# Patient Record
Sex: Female | Born: 1945 | Race: White | Hispanic: No | Marital: Married | Smoking: Former smoker
Health system: Southern US, Community
[De-identification: ages and names within clinical notes are randomized; demographics above are authoritative.]

## PROBLEM LIST (undated history)

## (undated) DIAGNOSIS — C801 Malignant (primary) neoplasm, unspecified: Secondary | ICD-10-CM

## (undated) DIAGNOSIS — E785 Hyperlipidemia, unspecified: Secondary | ICD-10-CM

## (undated) DIAGNOSIS — G20 Parkinson's disease: Secondary | ICD-10-CM

## (undated) DIAGNOSIS — K219 Gastro-esophageal reflux disease without esophagitis: Secondary | ICD-10-CM

## (undated) DIAGNOSIS — M818 Other osteoporosis without current pathological fracture: Secondary | ICD-10-CM

## (undated) DIAGNOSIS — J302 Other seasonal allergic rhinitis: Secondary | ICD-10-CM

## (undated) DIAGNOSIS — I219 Acute myocardial infarction, unspecified: Secondary | ICD-10-CM

## (undated) DIAGNOSIS — H269 Unspecified cataract: Secondary | ICD-10-CM

## (undated) DIAGNOSIS — K649 Unspecified hemorrhoids: Secondary | ICD-10-CM

## (undated) DIAGNOSIS — K602 Anal fissure, unspecified: Secondary | ICD-10-CM

## (undated) DIAGNOSIS — G20A1 Parkinson's disease without dyskinesia, without mention of fluctuations: Secondary | ICD-10-CM

## (undated) HISTORY — DX: Hyperlipidemia, unspecified: E78.5

## (undated) HISTORY — PX: TOOTH EXTRACTION: SUR596

## (undated) HISTORY — PX: BACK SURGERY: SHX140

## (undated) HISTORY — PX: COLONOSCOPY: SHX174

## (undated) HISTORY — DX: Malignant (primary) neoplasm, unspecified: C80.1

## (undated) HISTORY — PX: CORONARY ANGIOPLASTY: SHX604

## (undated) HISTORY — DX: Other osteoporosis without current pathological fracture: M81.8

## (undated) HISTORY — PX: COLECTOMY: SHX59

## (undated) HISTORY — PX: BLADDER SURGERY: SHX569

## (undated) HISTORY — PX: EYE SURGERY: SHX253

## (undated) HISTORY — PX: OTHER SURGICAL HISTORY: SHX169

## (undated) HISTORY — DX: Unspecified cataract: H26.9

---

## 1994-11-04 DIAGNOSIS — C801 Malignant (primary) neoplasm, unspecified: Secondary | ICD-10-CM

## 1994-11-04 HISTORY — DX: Malignant (primary) neoplasm, unspecified: C80.1

## 2004-09-04 ENCOUNTER — Ambulatory Visit: Payer: Self-pay | Admitting: General Surgery

## 2006-04-21 ENCOUNTER — Ambulatory Visit: Payer: Self-pay | Admitting: Gynecology

## 2006-04-21 ENCOUNTER — Encounter (INDEPENDENT_AMBULATORY_CARE_PROVIDER_SITE_OTHER): Payer: Self-pay | Admitting: Specialist

## 2006-06-16 ENCOUNTER — Ambulatory Visit: Payer: Self-pay | Admitting: Gynecology

## 2007-12-22 ENCOUNTER — Ambulatory Visit: Payer: Self-pay | Admitting: Family Medicine

## 2008-06-07 ENCOUNTER — Ambulatory Visit: Payer: Self-pay | Admitting: Family Medicine

## 2009-05-30 ENCOUNTER — Ambulatory Visit: Payer: Self-pay | Admitting: Family Medicine

## 2009-07-03 ENCOUNTER — Ambulatory Visit: Payer: Self-pay | Admitting: Gastroenterology

## 2009-09-26 ENCOUNTER — Ambulatory Visit (HOSPITAL_COMMUNITY): Admission: AD | Admit: 2009-09-26 | Discharge: 2009-09-27 | Payer: Self-pay | Admitting: Orthopaedic Surgery

## 2009-11-04 HISTORY — PX: OTHER SURGICAL HISTORY: SHX169

## 2010-06-07 ENCOUNTER — Ambulatory Visit: Payer: Self-pay

## 2011-02-06 LAB — URINALYSIS, ROUTINE W REFLEX MICROSCOPIC
Bilirubin Urine: NEGATIVE
Ketones, ur: 15 mg/dL — AB
Nitrite: NEGATIVE
Specific Gravity, Urine: 1.022 (ref 1.005–1.030)
Urobilinogen, UA: 0.2 mg/dL (ref 0.0–1.0)
pH: 5.5 (ref 5.0–8.0)

## 2011-02-06 LAB — URINE MICROSCOPIC-ADD ON

## 2011-02-06 LAB — BASIC METABOLIC PANEL
BUN: 12 mg/dL (ref 6–23)
Creatinine, Ser: 0.63 mg/dL (ref 0.4–1.2)
GFR calc non Af Amer: >60 mL/min (ref >60)
Glucose, Bld: 94 mg/dL (ref 70–99)

## 2011-02-06 LAB — CBC
HCT: 36.7 % (ref 36.0–46.0)
Platelets: 225 10*3/uL (ref 150–400)
RDW: 12.5 % (ref 11.5–15.5)
WBC: 9.9 10*3/uL (ref 4.0–10.5)

## 2011-07-11 ENCOUNTER — Ambulatory Visit: Payer: Self-pay | Admitting: Family Medicine

## 2012-11-19 ENCOUNTER — Ambulatory Visit: Payer: Self-pay | Admitting: Family Medicine

## 2012-11-26 ENCOUNTER — Ambulatory Visit: Payer: Self-pay | Admitting: Family Medicine

## 2013-05-04 ENCOUNTER — Encounter: Payer: Self-pay | Admitting: Diagnostic Neuroimaging

## 2013-05-04 ENCOUNTER — Ambulatory Visit (INDEPENDENT_AMBULATORY_CARE_PROVIDER_SITE_OTHER): Payer: No Typology Code available for payment source | Admitting: Diagnostic Neuroimaging

## 2013-05-04 VITALS — BP 155/82 | HR 67 | Ht 64.0 in | Wt 151.0 lb

## 2013-05-04 DIAGNOSIS — R292 Abnormal reflex: Secondary | ICD-10-CM | POA: Insufficient documentation

## 2013-05-04 DIAGNOSIS — M79601 Pain in right arm: Secondary | ICD-10-CM

## 2013-05-04 DIAGNOSIS — G20 Parkinson's disease: Secondary | ICD-10-CM | POA: Insufficient documentation

## 2013-05-04 DIAGNOSIS — M79609 Pain in unspecified limb: Secondary | ICD-10-CM

## 2013-05-04 NOTE — Progress Notes (Signed)
[facility]  PATIENT: Felicia Snyder DOB: [birth year]  REFERRING CLINICIAN: Craige Cotta HISTORY FROM: patient  REASON FOR VISIT: new consult   HISTORICAL  CHIEF COMPLAINT:  Chief Complaint  Patient presents with  . Neurologic Problem    NP#7    HISTORY OF PRESENT ILLNESS:   67 year old right-handed female here for valuation of right arm problems.  Since Jan 2014, patient has had problems with her right shoulder, diagnosed with frozen shoulder treated conservatively with steroid injection. She also went to physical therapy. Also around this time she developed aching and soreness in her right arm, elbow, with intermittent tingling sensation. She also noted decreased right arm swing. She's having more trouble with fine finger movements and handwriting as well. Patient's mother had Parkinson's disease and now patient is concerned about the similar diagnosis for herself. Patient denies any significant neck pain or radicular type pain.no problems with her left arm. No problems with her feet or legs. No significant balance and gait difficulty. She has lifelong decreased sensation of smell. No REM behavior sleep disorder symptoms. No significant constipation or anxiety.  REVIEW OF SYSTEMS: Full 14 system review of systems performed and notable only for aching muscles weakness.  ALLERGIES: No Known Allergies  HOME MEDICATIONS: No outpatient prescriptions prior to visit.   No facility-administered medications prior to visit.    PAST MEDICAL HISTORY: Past Medical History  Diagnosis Date  . Premature osteoporosis   . Cancer 1996    colon cancer    PAST SURGICAL HISTORY: Past Surgical History  Procedure Laterality Date  . Ruptured disk  [id number]  . Broken ankle  2011    FAMILY HISTORY: Family History  Problem Relation Age of Onset  . Stroke Mother   . Breast cancer Mother   . Parkinsonism Mother   . Heart attack Father   . Colon cancer Maternal Grandmother      SOCIAL HISTORY:  History   Social History  . Marital Status: Married    Spouse Name: N/A    Number of Children: 2  . Years of Education: masteres   Occupational History  . retired    Social History Main Topics  . Smoking status: Former Games developer  . Smokeless tobacco: Not on file  . Alcohol Use: Yes  . Drug Use: No  . Sexually Active: Not on file   Other Topics Concern  . Not on file   Social History Narrative  . No narrative on file     PHYSICAL EXAM  Filed Vitals:   05/04/13 0953  BP: 155/82  Pulse: 67  Height: 5\' 4"  (1.626 m)  Weight: 151 lb (68.493 kg)    Not recorded    Body mass index is 25.91 kg/(m^2).  GENERAL EXAM: Patient is in no distress  CARDIOVASCULAR: Regular rate and rhythm, no murmurs, no carotid bruits  NEUROLOGIC: MENTAL STATUS: awake, alert, language fluent, comprehension intact, naming intact CRANIAL NERVE: no papilledema on fundoscopic exam, pupils equal and reactive to light, visual fields full to confrontation, extraocular muscles intact, no nystagmus, facial sensation and strength symmetric, uvula midline, shoulder shrug symmetric, tongue midline; SLIGHT MASKED FACIES. NEG MYERSONS. NEG SNOUT MOTOR: INCR TONE IN RUE. MARKED BRADYKINESIA IN RUE. NO TREMOR. Full strength in the BUE, LLE; RLE FOOT DF 4/5.  SENSORY: normal and symmetric to light touch, pinprick, temperature, vibration COORDINATION: finger-nose-finger SLOW IN RUE. REFLEXES: RUE 3, LUE 2, KNEES 2, RIGHT ANKLE 0, LEFT ANKLE 1. GAIT/STATION: NO ARM SWING IN RUE. SLIGHTLY STOOPED  POSTURE. narrow based gait; able to walk on toes, heels and tandem; romberg is negative   DIAGNOSTIC DATA (LABS, IMAGING, TESTING) - I reviewed patient records, labs, notes, testing and imaging myself where available.  Lab Results  Component Value Date   WBC 9.9 09/26/2009   HGB 12.7 09/26/2009   HCT 36.7 09/26/2009   MCV 90.2 09/26/2009   PLT 225 09/26/2009      Component Value  Date/Time   NA 139 09/26/2009 1652   K 3.5 09/26/2009 1652   CL 103 09/26/2009 1652   CO2 25 09/26/2009 1652   GLUCOSE 94 09/26/2009 1652   BUN 12 09/26/2009 1652   CREATININE 0.63 09/26/2009 1652   CALCIUM 9.6 09/26/2009 1652   GFRNONAA >60 09/26/2009 1652   GFRAA  Value: >60        The eGFR has been calculated using the MDRD equation. This calculation has not been validated in all clinical situations. eGFR's persistently <60 mL/min signify possible Chronic Kidney Disease. 09/26/2009 1652   No results found for this basename: CHOL, HDL, LDLCALC, LDLDIRECT, TRIG, CHOLHDL   No results found for this basename: HGBA1C   No results found for this basename: VITAMINB12   No results found for this basename: TSH      ASSESSMENT AND PLAN  67 y.o. year old female  has a past medical history of Premature osteoporosis and Cancer (1996). here with constellation of symptoms affecting her right arm including decreased right arm swing, bradykinesia, rigidity, hyperreflexia.   Differential diagnosis: Parkinson's disease, vascular parkinsonism, stroke, cervical spine pathology  PLAN: 1. We'll check MRI brain and cervical spine 2. If unremarkable, then I will try empiric trial of carbidopa/levodopa   Orders Placed This Encounter  Procedures  . MR Brain Wo Contrast  . MR Cervical Spine Wo Contrast    Suanne Marker, MD 05/04/2013, 11:01 AM Certified in Neurology, Neurophysiology and Neuroimaging  [facility] [address], [address] [place], [place] [address] [phone]

## 2013-05-04 NOTE — Patient Instructions (Signed)
I will check MRI brain and cervical spine. 

## 2013-05-13 ENCOUNTER — Telehealth: Payer: Self-pay | Admitting: Diagnostic Neuroimaging

## 2013-05-13 NOTE — Telephone Encounter (Signed)
Okay to dispense Xanax?  Please advise.  Thank you.

## 2013-05-13 NOTE — Telephone Encounter (Signed)
Dispensed Alprazolam 0.5mg  Tablets #3 Lot [id number] Exp 03/2014.  I called the patient.  She will be in to pick up meds tomorrow.

## 2013-05-13 NOTE — Telephone Encounter (Signed)
Xanax ok. -VRP

## 2013-05-15 DIAGNOSIS — G20 Parkinson's disease: Secondary | ICD-10-CM

## 2013-05-18 ENCOUNTER — Other Ambulatory Visit: Payer: Self-pay | Admitting: Diagnostic Neuroimaging

## 2013-05-18 DIAGNOSIS — M79601 Pain in right arm: Secondary | ICD-10-CM

## 2013-05-18 DIAGNOSIS — R292 Abnormal reflex: Secondary | ICD-10-CM

## 2013-05-18 DIAGNOSIS — G20 Parkinson's disease: Secondary | ICD-10-CM

## 2013-05-20 ENCOUNTER — Telehealth: Payer: Self-pay | Admitting: Diagnostic Neuroimaging

## 2013-05-24 NOTE — Telephone Encounter (Signed)
Called patient. No answer. Left vmail.  

## 2013-05-25 ENCOUNTER — Telehealth: Payer: Self-pay | Admitting: Diagnostic Neuroimaging

## 2013-05-25 NOTE — Telephone Encounter (Signed)
I called pt. No answer. -VRP 

## 2013-05-25 NOTE — Telephone Encounter (Signed)
Patient requesting MRI results

## 2013-05-26 ENCOUNTER — Telehealth: Payer: Self-pay | Admitting: Diagnostic Neuroimaging

## 2013-05-26 DIAGNOSIS — G20 Parkinson's disease: Secondary | ICD-10-CM

## 2013-05-26 MED ORDER — CARBIDOPA-LEVODOPA 25-100 MG PO TABS: 1.0000 | ORAL_TABLET | Freq: Three times a day (TID) | ORAL | Status: DC

## 2013-05-26 NOTE — Telephone Encounter (Signed)
I called patient and reviewed results. MRI brain unremarkable. 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  PLAN: 1. Start carbidopa/levodopa 2. Patient requesting second opinion at [facility] movement disorder clinic. I will set this up.  Suanne Marker, MD 05/26/2013, 4:08 PM Certified in Neurology, Neurophysiology and Neuroimaging  [facility] [address] [place], [place] [address] [phone]

## 2013-06-09 ENCOUNTER — Other Ambulatory Visit: Payer: Self-pay

## 2013-08-16 ENCOUNTER — Ambulatory Visit: Payer: No Typology Code available for payment source | Admitting: Diagnostic Neuroimaging

## 2013-09-09 ENCOUNTER — Other Ambulatory Visit: Payer: Self-pay

## 2013-10-26 ENCOUNTER — Ambulatory Visit: Payer: No Typology Code available for payment source | Admitting: Diagnostic Neuroimaging

## 2014-05-19 LAB — LIPID PANEL
CHOLESTEROL: 186 mg/dL (ref 0–200)
HDL: 73 mg/dL — AB (ref 35–70)
LDL Cholesterol: 103 mg/dL
Triglycerides: 48 mg/dL (ref 40–160)

## 2014-05-19 LAB — BASIC METABOLIC PANEL
BUN: 11 mg/dL (ref 4–21)
Creatinine: 0.7 mg/dL (ref 0.5–1.1)
Glucose: 92 mg/dL
Potassium: 4.3 mmol/L (ref 3.4–5.3)
Sodium: 141 mmol/L (ref 137–147)

## 2014-06-15 ENCOUNTER — Ambulatory Visit: Payer: Self-pay | Admitting: Family Medicine

## 2014-06-17 ENCOUNTER — Ambulatory Visit: Payer: Self-pay | Admitting: Family Medicine

## 2014-10-13 DIAGNOSIS — Z85038 Personal history of other malignant neoplasm of large intestine: Secondary | ICD-10-CM | POA: Insufficient documentation

## 2014-10-14 ENCOUNTER — Ambulatory Visit: Payer: Self-pay | Admitting: Family Medicine

## 2014-10-19 ENCOUNTER — Ambulatory Visit: Payer: Self-pay | Admitting: Podiatry

## 2014-11-04 HISTORY — PX: TOTAL ABDOMINAL HYSTERECTOMY: SHX209

## 2014-11-18 ENCOUNTER — Ambulatory Visit: Payer: Self-pay | Admitting: Gastroenterology

## 2014-12-26 DIAGNOSIS — K5909 Other constipation: Secondary | ICD-10-CM | POA: Insufficient documentation

## 2014-12-27 DIAGNOSIS — N812 Incomplete uterovaginal prolapse: Secondary | ICD-10-CM | POA: Insufficient documentation

## 2015-04-25 ENCOUNTER — Other Ambulatory Visit: Payer: Self-pay | Admitting: Otolaryngology

## 2015-04-25 DIAGNOSIS — R1310 Dysphagia, unspecified: Secondary | ICD-10-CM

## 2015-05-01 ENCOUNTER — Other Ambulatory Visit: Payer: Self-pay

## 2015-05-03 ENCOUNTER — Ambulatory Visit: Payer: Self-pay

## 2015-05-15 ENCOUNTER — Telehealth: Payer: Self-pay | Admitting: Family Medicine

## 2015-05-15 NOTE — Telephone Encounter (Signed)
Have cleared her for surgery: denies any cough, chest pain, palpitations or shortness of breath. Will fax latest labs, CXR, EKG, and colonoscopy to [facility].

## 2015-05-15 NOTE — Telephone Encounter (Signed)
Pt states she is returning a call to [place].  JM#[phone]

## 2015-05-18 ENCOUNTER — Ambulatory Visit: Payer: Self-pay

## 2015-06-29 ENCOUNTER — Ambulatory Visit: Payer: Self-pay

## 2015-07-07 ENCOUNTER — Ambulatory Visit
Admission: RE | Admit: 2015-07-07 | Discharge: 2015-07-07 | Disposition: A | Discharge status: Home / Self Care | Payer: Medicare Other | Source: Ambulatory Visit | Attending: Otolaryngology | Admitting: Otolaryngology

## 2015-07-07 DIAGNOSIS — K219 Gastro-esophageal reflux disease without esophagitis: Secondary | ICD-10-CM | POA: Diagnosis not present

## 2015-07-07 DIAGNOSIS — R1313 Dysphagia, pharyngeal phase: Secondary | ICD-10-CM | POA: Insufficient documentation

## 2015-07-07 DIAGNOSIS — R1310 Dysphagia, unspecified: Secondary | ICD-10-CM

## 2015-07-07 DIAGNOSIS — G20 Parkinson's disease: Secondary | ICD-10-CM | POA: Diagnosis not present

## 2015-07-07 DIAGNOSIS — F458 Other somatoform disorders: Secondary | ICD-10-CM | POA: Diagnosis present

## 2015-07-07 DIAGNOSIS — R05 Cough: Secondary | ICD-10-CM | POA: Diagnosis present

## 2015-07-07 NOTE — Therapy (Signed)
[place], [place], [address] Phone: [phone]   Fax:     Modified Barium Swallow  Patient Details  Name: Felicia Snyder MRN: [id number] Date of Birth: [birth year] Referring Provider:  Carloyn Manner, MD  Encounter Date: 07/07/2015      End of Session - 07/07/15 1322    Visit Number 1   Number of Visits 1   Date for SLP Re-Evaluation 07/07/15   SLP Start Time 4967   SLP Stop Time  1323   SLP Time Calculation (min) 44 min   Activity Tolerance Patient tolerated treatment well      Past Medical History  Diagnosis Date  . Premature osteoporosis   . Cancer 1996    colon cancer    Past Surgical History  Procedure Laterality Date  . Ruptured disk  [id number]  . Broken ankle  2011    There were no vitals filed for this visit.  Visit Diagnosis: Pharyngeal dysphagia  Dysphagia - Plan: DG OP Swallowing Func-Medicare/Speech Path, DG OP Swallowing Func-Medicare/Speech Path     Subjective: Patient behavior:Patient is alert; able to convey her medical history and follow directions.  Chief complaint: globus sensation, cough   Objective:  Radiological Procedure: A videoflouroscopic evaluation of oral-preparatory, reflex initiation, and pharyngeal phases of the swallow was performed; as well as a screening of the upper esophageal phase.  I. POSTURE: Upright in MBS chair  II. VIEW: Lateral  III. COMPENSATORY STRATEGIES: N/A  IV. BOLUSES ADMINISTERED:   Thin Liquid: 2 sips, 3 rapid multiple sips   Nectar-thick Liquid: 1 large    Puree: 2 teaspoons   Mechanical Soft: 1/4 graham cracker in apple sauce  V. RESULTS OF EVALUATION: A. ORAL PREPARATORY PHASE: (The lips, tongue, and velum are observed for strength and coordination) Within normal limits       **Overall Severity Rating: WNL  B. SWALLOW INITIATION/REFLEX: (The reflex is normal if "triggered" by the time the bolus  reached the base of the tongue) Within normal limits  **Overall Severity Rating: WNL  C. PHARYNGEAL PHASE: (Pharyngeal function is normal if the bolus shows rapid, smooth, and continuous transit through the pharynx and there is no pharyngeal residue after the swallow) Minimal decreased tongue base retraction with minimal pharyngeal residue post swallow  **Overall Severity Rating: Minimal  D. LARYNGEAL PENETRATION: (Material entering into the laryngeal inlet/vestibule but not aspirated) ? Flash penetration vs. Coating of pyriform sinus  E. ASPIRATION: None  F. ESOPHAGEAL PHASE: (Screening of the upper esophagus)  N/A  ASSESSMENT:  This 69 year old woman; with globus sensation, laryngopharyngeal reflux, and Parkinson's disease; is presenting with minimal pharyngeal dysphagia.  Oral control of the bolus including oral hold, rotary mastication, and anterior to posterior transfer is within normal limits.  Timing of the pharyngeal swallow is within normal limits.  There is trace pharyngeal residue post swallow secondary minimally decreased tongue base retraction for complete pharyngeal pressure generation.  This finding is consistent with effects of laryngopharyngeal reflux (inflammation, edema, and resultant decreased sensation of the larynx and pharynx).  Other pharyngeal aspects of the swallow including hyolaryngeal excursion, duration/amplitude of UES opening, and laryngeal vestibule closure at the height of the swallow are within normal limits.  There is no observed tracheal aspiration.  There was one episode of potential flash trace laryngeal penetration, but it was difficult to discern from coating of the pyriform sinus vs. actual laryngeal penetration.   The patient is not at significant  risk for prandial aspiration and aspiration does not appear to be the etiology of her symptoms.   PLAN/RECOMMENDATIONS:   A. Diet: Regular   B. Swallowing Precautions: Reflux precautions   C. Recommended  consultation to N/A   D. Therapy recommendations N/A  E. Results and recommendations were discussed with the patient immediately following the study                         and the final report will be routed to the referring MD.        G-Codes - 07/26/15 1324    Functional Assessment Tool Used MBS   Functional Limitations Swallowing   Swallow Current Status (W5809) At least 1 percent but less than 20 percent impaired, limited or restricted   Swallow Goal Status (X8338) At least 1 percent but less than 20 percent impaired, limited or restricted   Swallow Discharge Status [id number]) At least 1 percent but less than 20 percent impaired, limited or restricted          Problem List Patient Active Problem List   Diagnosis Date Noted  . Parkinsonism 05/04/2013  . Right arm pain 05/04/2013  . Hyperreflexia 05/04/2013   Leroy Sea, MS/CCC- SLP  Lou Miner 07/26/2015, Nickie Retort PM  [facility] [place], [place], [address] Phone: [phone]   Fax:

## 2015-10-02 DIAGNOSIS — I252 Old myocardial infarction: Secondary | ICD-10-CM | POA: Insufficient documentation

## 2015-10-02 DIAGNOSIS — Z9889 Other specified postprocedural states: Secondary | ICD-10-CM | POA: Insufficient documentation

## 2015-12-11 DIAGNOSIS — N3941 Urge incontinence: Secondary | ICD-10-CM | POA: Diagnosis not present

## 2015-12-11 DIAGNOSIS — N8111 Cystocele, midline: Secondary | ICD-10-CM | POA: Diagnosis not present

## 2015-12-11 DIAGNOSIS — R3914 Feeling of incomplete bladder emptying: Secondary | ICD-10-CM | POA: Diagnosis not present

## 2015-12-18 DIAGNOSIS — H2513 Age-related nuclear cataract, bilateral: Secondary | ICD-10-CM | POA: Diagnosis not present

## 2015-12-20 DIAGNOSIS — C4361 Malignant melanoma of right upper limb, including shoulder: Secondary | ICD-10-CM | POA: Diagnosis not present

## 2016-01-08 DIAGNOSIS — I214 Non-ST elevation (NSTEMI) myocardial infarction: Secondary | ICD-10-CM | POA: Diagnosis not present

## 2016-01-08 DIAGNOSIS — Z9889 Other specified postprocedural states: Secondary | ICD-10-CM | POA: Diagnosis not present

## 2016-03-12 DIAGNOSIS — G20 Parkinson's disease: Secondary | ICD-10-CM | POA: Diagnosis not present

## 2016-04-15 ENCOUNTER — Ambulatory Visit: Payer: Medicare HMO | Attending: Psychiatry | Admitting: Occupational Therapy

## 2016-04-15 ENCOUNTER — Encounter: Payer: Self-pay | Admitting: Occupational Therapy

## 2016-04-15 DIAGNOSIS — R2681 Unsteadiness on feet: Secondary | ICD-10-CM | POA: Insufficient documentation

## 2016-04-15 DIAGNOSIS — M6281 Muscle weakness (generalized): Secondary | ICD-10-CM

## 2016-04-15 DIAGNOSIS — R262 Difficulty in walking, not elsewhere classified: Secondary | ICD-10-CM | POA: Diagnosis not present

## 2016-04-15 DIAGNOSIS — R278 Other lack of coordination: Secondary | ICD-10-CM | POA: Diagnosis not present

## 2016-04-16 ENCOUNTER — Encounter: Payer: Self-pay | Admitting: Occupational Therapy

## 2016-04-16 NOTE — Therapy (Signed)
[place] MAIN [facility] SERVICES [address] [place], [place], [address] Phone: [phone]   Fax:  [phone]  Occupational Therapy Evaluation  Patient Details  Name: Felicia Snyder MRN: [id number] Date of Birth: [birth year] Referring Provider: Mervyn Skeeters  Encounter Date: 04/15/2016      OT End of Session - 04/16/16 1544    Visit Number 1   Number of Visits 17   Authorization Type Medicare G code 1   OT Start Time 1016   OT Stop Time 1120   OT Time Calculation (min) 64 min   Activity Tolerance Patient tolerated treatment well   Behavior During Therapy [facility] for tasks assessed/performed      Past Medical History  Diagnosis Date  . Premature osteoporosis   . Cancer [facility]) 1996    colon cancer    Past Surgical History  Procedure Laterality Date  . Ruptured disk  [id number]  . Broken ankle  2011    There were no vitals filed for this visit.      Subjective Assessment - 04/15/16 1031    Subjective  Patient reports she has had Parkinson's for about 4 years, she has a history of right arm numbness, right shoulder drop and right foot drags, had disc surgery in the 1980s.  She had a frozen shoulder about 5 years ago and unsure if she also had rotator cuff issues in the past as well.    Limitations see above subjective from the patient   Patient Stated Goals Patient reports she wants to have better movement in the right arm, be independent.    Currently in Pain? No/denies   Multiple Pain Sites No           [facility] OT Assessment - 04/15/16 1032    Assessment   Diagnosis Parkinson's disease   Referring Provider [place]   Onset Date 05/04/12   Prior Therapy no   Balance Screen   Has the patient fallen in the past 6 months No   Has the patient had a decrease in activity level because of a fear of falling?  No   Is the patient reluctant to leave their home because of a fear of falling?  No   Home  Environment   Family/patient  expects to be discharged to: Private residence   Living Arrangements Spouse/significant other   Available Help at Discharge Family   Type of Home Two level   Alternate Level Stairs - Number of Steps 14   Bathroom Shower/Tub Walk-in Media planner   Lives With Spouse   Prior Function   Level of [place] Retired   ADL   Eating/Feeding Independent   Grooming Modified independent   Upper Body Bathing Modified independent   Lower Body Bathing Modified independent   Upper Body Dressing Independent   Lower Body Dressing Independent   Toilet Tranfer Modified independent   [place] independent   Tub/Shower Transfer Modified independent   IADL   Prior Level of [place] care of all shopping needs independently   Prior Level of Function Light Housekeeping independent   [place] alone or with occasional assistance   Prior Level of Function Meal Prep independent   Meal Prep Able to complete simple warm meal prep   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   Medication Management Is responsible for taking medication in correct dosages at correct time   Prior Level of Function Financial Management independent   Financial Management Manages financial matters independently (budgets, writes checks, pays rent, bills goes to bank), collects and keeps track of income   Mobility   Mobility Status Independent   Written Expression   Dominant Hand Right   Vision - History   Baseline Vision Wears glasses all the time   Additional Comments has bifocals and with posture she has some difficulty    Cognition   Overall Cognitive Status Within Functional Limits for tasks assessed   Sensation   Light Touch Appears Intact   Stereognosis Appears Intact   Hot/Cold  Appears Intact   Proprioception Appears Intact   Coordination   Gross Motor Movements are Fluid and Coordinated No   Fine Motor Movements are Fluid and Coordinated No   Finger Nose Finger Test mild impairment noted   9 Hole Peg Test Right;Left   Right 9 Hole Peg Test 26 secs   Left 9 Hole Peg Test 27 secs   ROM / Strength   AROM / PROM / Strength AROM;Strength   AROM   Overall AROM  Deficits   Overall AROM Comments Right shoulder flexion to 125 degrees, left 142 degrees.  ABD of the shoulder Right 121 degrees, left 144 degrees.    Strength   Overall Strength Deficits   Overall Strength Comments 4-/5 overall   Hand Function   Right Hand Grip (lbs) 50   Right Hand Lateral Pinch 15 lbs   Right Hand 3 Point Pinch 14 lbs   Left Hand Grip (lbs) 40   Left Hand Lateral Pinch 13 lbs   Left 3 point pinch 14 lbs      Patient reports transient numbness in the right arm at times, especially when she feels under stress, mostly around forearm muscle, mild tingling at times and has difficulty with feeling weight of objects in right hand, for example the difference between regular utensil and plastic utensil.   6 minute walk test 1225 feet 5 times sit to stand 14 secs BERG balance test 51/56 Freezing of gait questionairre 7                    OT Education - 04/16/16 1543    Education provided Yes   Education Details LSVT BIG program details, role of OT, goals   Person(s) Educated Patient   Methods Explanation   Comprehension Verbalized understanding             OT Long Term Goals - 04/16/16 1556    OT LONG TERM GOAL #1   Title Patient will improve gait speed and endurance and be able to walk 1350 feet in 6 minutes to negotiate around the home and community safely in 4 weeks   Baseline 6 minute walk test at evaluation 1225 feet   Time 4   Period Weeks   Status New   OT LONG TERM GOAL #2   Title Patient will complete HEP for maximal daily exercises with modified  independence in 4 weeks     Baseline no current program   Time 4   Period Weeks   Status New   OT LONG TERM GOAL #3   Title Patient will transfer from sit to stand without the use of arms safely and independently from a variety of chairs/surfaces in 4 weeks.   Baseline difficulty from lower surfaces  Time 4   Period Weeks   Status New   OT LONG TERM GOAL #4   Title Patient will decrease frequency of freezing episodes with score of 6 or less on Freezing of Gait questionnaire.      Baseline score of 7 on eval    Time 4   Period Weeks   Status New   OT LONG TERM GOAL #5   Title Patient will be able to obtain clothing from the dryer with modified independence.     Baseline difficulty at eval    Time 4   Period Weeks   Status New               Plan - 16-May-2016 1550    Clinical Impression Statement Patient is a 70 yo female diagnosed with Parkinson's disease and was referred by her physician for LSVT BIG program. Patient presents with forwards flexed posture, occasional tremors in the right hand, decreased step length with gait patterns, decreased reciprocal arm swing (absent on the right), decreased balance, freezing of gait with initiation of gait as well as with turns, decreased coordination, and muscle strength which affect her ability to perform daily tasks. The patient is judged to be an excellent candidate for the LSVT BIG program. She would benefit from and was referred for the LSVT BIG program which is an intensive program designed specifically for Parkinson's patients with a focus on increasing amplitude and speed of movements, improving self-care and daily tasks and providing patients with daily exercises to improve overall function. It is recommended that the patient receive the LSVT BIG program which is comprised of 16 intensive sessions (4 times per week for 4 weeks, one hour sessions). Prognosis for improvement is good based on her motivation and family support. LSVT BIG  has been documented in the literature as efficacious for individuals with Parkinson's disease.       Rehab Potential Good   OT Frequency 4x / week   OT Duration 4 weeks   OT Treatment/Interventions Self-care/ADL training;Therapeutic exercise;Functional Mobility Training;Patient/family education;Neuromuscular education;Balance training;Therapeutic exercises;DME and/or AE instruction;Therapeutic activities;Gait Training;Stair Training   Plan LSVT BIG protocol for evaluation plus 16 treatment sessions.    Consulted and Agree with Plan of Care Patient      Patient will benefit from skilled therapeutic intervention in order to improve the following deficits and impairments:  Abnormal gait, Improper body mechanics, Decreased knowledge of use of DME, Decreased strength, Impaired flexibility, Decreased balance, Decreased mobility, Difficulty walking, Decreased range of motion, Decreased coordination, Impaired UE functional use  Visit Diagnosis: Difficulty in walking, not elsewhere classified  Unsteadiness on feet  Muscle weakness (generalized)  Other lack of coordination      G-Codes - 2016-05-16 1546    Functional Assessment Tool Used clinical judgment, 6 minute walk test, 5 times sit to stand, BERG balance test, freezing of gait questionairre, 9 hole peg test, strength and ROM testing.   Functional Limitation Mobility: Walking and moving around   Mobility: Walking and Moving Around Current Status [phone]) At least 20 percent but less than 40 percent impaired, limited or restricted   Mobility: Walking and Moving Around Goal Status [phone]) At least 1 percent but less than 20 percent impaired, limited or restricted      Problem List Patient Active Problem List   Diagnosis Date Noted  . Parkinsonism ([place]) 05/04/2013  . Right arm pain 05/04/2013  . Hyperreflexia 05/04/2013   Amy T Lovett, OTR/L, CLT  Lovett,Amy 05/16/2016, 4:04 PM  [place] [facility]  SERVICES [address] [place], [place], [address] Phone: [phone]   Fax:  [phone]  Name: Redena Bugaj MRN: [id number] Date of Birth: 04-13-1946

## 2016-04-22 ENCOUNTER — Ambulatory Visit: Payer: Medicare HMO | Admitting: Occupational Therapy

## 2016-04-22 ENCOUNTER — Encounter: Payer: Self-pay | Admitting: Occupational Therapy

## 2016-04-22 DIAGNOSIS — R262 Difficulty in walking, not elsewhere classified: Secondary | ICD-10-CM | POA: Diagnosis not present

## 2016-04-22 DIAGNOSIS — M6281 Muscle weakness (generalized): Secondary | ICD-10-CM

## 2016-04-22 DIAGNOSIS — R278 Other lack of coordination: Secondary | ICD-10-CM

## 2016-04-22 DIAGNOSIS — R2681 Unsteadiness on feet: Secondary | ICD-10-CM

## 2016-04-23 ENCOUNTER — Ambulatory Visit: Payer: Medicare HMO | Admitting: Occupational Therapy

## 2016-04-23 DIAGNOSIS — M6281 Muscle weakness (generalized): Secondary | ICD-10-CM

## 2016-04-23 DIAGNOSIS — R278 Other lack of coordination: Secondary | ICD-10-CM

## 2016-04-23 DIAGNOSIS — R262 Difficulty in walking, not elsewhere classified: Secondary | ICD-10-CM

## 2016-04-23 DIAGNOSIS — R2681 Unsteadiness on feet: Secondary | ICD-10-CM

## 2016-04-23 NOTE — Therapy (Signed)
[place] MAIN [facility] SERVICES [address] [place], [place], [address] Phone: [phone]   Fax:  [phone]  Occupational Therapy Treatment  Patient Details  Name: Felicia Snyder MRN: [id number] Date of Birth: 10-16-1946 Referring Provider: Mervyn Skeeters  Encounter Date: 04/22/2016      OT End of Session - 04/22/16 2017    Visit Number 2   Number of Visits 17   Authorization Type Medicare G code 2   OT Start Time 1015   OT Stop Time 1114   OT Time Calculation (min) 59 min   Activity Tolerance Patient tolerated treatment well   Behavior During Therapy [facility] for tasks assessed/performed      Past Medical History  Diagnosis Date  . Premature osteoporosis   . Cancer [facility]) 1996    colon cancer    Past Surgical History  Procedure Laterality Date  . Ruptured disk  [id number]  . Broken ankle  2011    There were no vitals filed for this visit.      Subjective Assessment - 04/22/16 2008    Subjective  Patient reports she had a good weekend and is ready to get started with therapy this date. No complaints.    Patient Stated Goals Patient reports she wants to have better movement in the right arm, be independent.    Currently in Pain? No/denies   Multiple Pain Sites No                      OT Treatments/Exercises (OP) - 04/23/16 0001    ADLs   ADL Comments Formulation as follows:  1) sit to stand, 2) reaching to the back of her head to perform hair care, 3) reaching down to shoes for tying, 4) fine motor coordination to thread a needle for sewing, 5) picking up and manipulation of small objects 1/2 inch in size or less.    Neurological Re-education Exercises   Other Exercises 1 Patient seen for initial instruction of LSVT BIG exercises: LSVT Daily Session Maximal Daily Exercises: Sustained movements are designed to rescale the amplitude of movement output for generalization to daily functional activities. Performed as  follows for 1 set of 10 repetitions each: Multi directional sustained movements- 1) Floor to ceiling, 2) Side to side. Multi directional Repetitive movements performed in standing and are designed to provide retraining effort needed for sustained muscle activation in tasks Performed as follows: 3) Step and reach forward, 4) Step and Reach Backwards, 5) Step and reach sideways, 6) Rock and reach forward/backward, 7) Rock and reach sideways. Sit to stand from mat table on lowest setting with cues for weight shift, technique and CGA for 10 reps for 1 set.   Patient performing reciprocal stepping exercise with CGA and cues for 10 reps each foot, stair negotiation 4 steps for 5 reps each, cues for big turns, CGA.    Other Exercises 2 Functional mobility with cues for amplitude of steps and reciprocal arm swing for 800 feet with one rest break required, outdoor surfaces with sloped areas, winding pathways, negotiation around obstacles with SBA and cues.  Focused on reciprocal arm swing with cues for right arm.                  OT Education - 04/23/16 2016    Education provided Yes   Education Details Maximal daily exercises, functional component tasks, reciprocal arm swing   Person(s) Educated Patient   Methods Explanation;Demonstration;Verbal cues  Comprehension Verbal cues required;Returned demonstration;Verbalized understanding             OT Long Term Goals - 04/16/16 1556    OT LONG TERM GOAL #1   Title Patient will improve gait speed and endurance and be able to walk 1350 feet in 6 minutes to negotiate around the home and community safely in 4 weeks   Baseline 6 minute walk test at evaluation 1225 feet   Time 4   Period Weeks   Status New   OT LONG TERM GOAL #2   Title Patient will complete HEP for maximal daily exercises with modified independence in 4 weeks     Baseline no current program   Time 4   Period Weeks   Status New   OT LONG TERM GOAL #3   Title Patient will  transfer from sit to stand without the use of arms safely and independently from a variety of chairs/surfaces in 4 weeks.   Baseline difficulty from lower surfaces   Time 4   Period Weeks   Status New   OT LONG TERM GOAL #4   Title Patient will decrease frequency of freezing episodes with score of 6 or less on Freezing of Gait questionnaire.      Baseline score of 7 on eval    Time 4   Period Weeks   Status New   OT LONG TERM GOAL #5   Title Patient will be able to obtain clothing from the dryer with modified independence.     Baseline difficulty at eval    Time 4   Period Weeks   Status New               Plan - 04/23/16 2018    Clinical Impression Statement Patient engaging in initiation of maximal daily exercises this date, able to complete with CGA and cues.  She was able to identify 5 functional component tasks to implement into therapy sessions to impact daily tasks.  Will need additional instruction on daily exercises and continued assessment of balance during exercises prior to issuing home program.  Decreased arm swing on the right during functional mobliity tasks.    Rehab Potential Good   OT Frequency 4x / week   OT Duration 4 weeks   OT Treatment/Interventions Self-care/ADL training;Therapeutic exercise;Functional Mobility Training;Patient/family education;Neuromuscular education;Balance training;Therapeutic exercises;DME and/or AE instruction;Therapeutic activities;Gait Proofreader and Agree with Plan of Care Patient      Patient will benefit from skilled therapeutic intervention in order to improve the following deficits and impairments:  Abnormal gait, Improper body mechanics, Decreased knowledge of use of DME, Decreased strength, Impaired flexibility, Decreased balance, Decreased mobility, Difficulty walking, Decreased range of motion, Decreased coordination, Impaired UE functional use  Visit Diagnosis: Difficulty in walking, not  elsewhere classified  Unsteadiness on feet  Muscle weakness (generalized)  Other lack of coordination    Problem List Patient Active Problem List   Diagnosis Date Noted  . Parkinsonism ([place]) 05/04/2013  . Right arm pain 05/04/2013  . 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

## 2016-04-24 ENCOUNTER — Encounter: Payer: Self-pay | Admitting: Occupational Therapy

## 2016-04-24 ENCOUNTER — Ambulatory Visit: Payer: Medicare HMO | Admitting: Occupational Therapy

## 2016-04-24 DIAGNOSIS — L820 Inflamed seborrheic keratosis: Secondary | ICD-10-CM | POA: Diagnosis not present

## 2016-04-24 DIAGNOSIS — L570 Actinic keratosis: Secondary | ICD-10-CM | POA: Diagnosis not present

## 2016-04-24 DIAGNOSIS — X32XXXD Exposure to sunlight, subsequent encounter: Secondary | ICD-10-CM | POA: Diagnosis not present

## 2016-04-24 DIAGNOSIS — R262 Difficulty in walking, not elsewhere classified: Secondary | ICD-10-CM | POA: Diagnosis not present

## 2016-04-24 DIAGNOSIS — Z08 Encounter for follow-up examination after completed treatment for malignant neoplasm: Secondary | ICD-10-CM | POA: Diagnosis not present

## 2016-04-24 DIAGNOSIS — R2681 Unsteadiness on feet: Secondary | ICD-10-CM

## 2016-04-24 DIAGNOSIS — R278 Other lack of coordination: Secondary | ICD-10-CM

## 2016-04-24 DIAGNOSIS — Z1283 Encounter for screening for malignant neoplasm of skin: Secondary | ICD-10-CM | POA: Diagnosis not present

## 2016-04-24 DIAGNOSIS — M6281 Muscle weakness (generalized): Secondary | ICD-10-CM

## 2016-04-24 DIAGNOSIS — Z85820 Personal history of malignant melanoma of skin: Secondary | ICD-10-CM | POA: Diagnosis not present

## 2016-04-25 ENCOUNTER — Ambulatory Visit: Payer: Medicare HMO | Admitting: Occupational Therapy

## 2016-04-25 ENCOUNTER — Encounter: Payer: Self-pay | Admitting: Occupational Therapy

## 2016-04-25 DIAGNOSIS — M6281 Muscle weakness (generalized): Secondary | ICD-10-CM

## 2016-04-25 DIAGNOSIS — R262 Difficulty in walking, not elsewhere classified: Secondary | ICD-10-CM | POA: Diagnosis not present

## 2016-04-25 DIAGNOSIS — R2681 Unsteadiness on feet: Secondary | ICD-10-CM

## 2016-04-25 DIAGNOSIS — R278 Other lack of coordination: Secondary | ICD-10-CM

## 2016-04-25 NOTE — Therapy (Signed)
[place] MAIN [facility] SERVICES [address] [place], [place], [address] Phone: [phone]   Fax:  [phone]  Occupational Therapy Treatment  Patient Details  Name: Ashvika Llorente MRN: [id number] Date of Birth: January 26, 1946 Referring Provider: Mervyn Skeeters  Encounter Date: 04/23/2016      OT End of Session - 04/24/16 2120    Visit Number 3   Number of Visits 17   Authorization Type Medicare G code 3   OT Start Time 1030   OT Stop Time 1131   OT Time Calculation (min) 61 min   Activity Tolerance Patient tolerated treatment well   Behavior During Therapy [facility] for tasks assessed/performed      Past Medical History  Diagnosis Date  . Premature osteoporosis   . Cancer [facility]) 1996    colon cancer    Past Surgical History  Procedure Laterality Date  . Ruptured disk  [id number]  . Broken ankle  2011    There were no vitals filed for this visit.      Subjective Assessment - 04/24/16 2115    Subjective  Patient reports she was able to use the chair for adapted exercises last night and felt she did better with exercises.    Patient Stated Goals Patient reports she wants to have better movement in the right arm, be independent.    Currently in Pain? No/denies   Multiple Pain Sites No                      OT Treatments/Exercises (OP) - 04/24/16 2117    ADLs   ADL Comments Participation in functional component tasks as follows:  1) sit to stand, 2) reaching to the back of her head to perform hair care, 3) reaching down to shoes for tying, 4) fine motor coordination to thread a needle for sewing, 5) picking up and manipulation of small objects 1/2 inch in size or less, cues provided for BIG movements and technique.   Neurological Re-education Exercises   Other Exercises 1 Patient seen for LSVT BIG exercises: LSVT Daily Session Maximal Daily Exercises: Sustained movements are designed to rescale the amplitude of movement output  for generalization to daily functional activities. Performed as follows for 1 set of 10 repetitions each: Multi directional sustained movements- 1) Floor to ceiling, 2) Side to side. Multi directional Repetitive movements performed in standing and are designed to provide retraining effort needed for sustained muscle activation in tasks Performed as follows: 3) Step and reach forward, 4) Step and Reach Backwards, 5) Step and reach sideways, 6) Rock and reach forward/backward, 7) Rock and reach sideways. Sit to stand from mat table on lowest setting with cues for weight shift, technique and CGA for 10 reps for 1 set. Patient performing reciprocal stepping exercise with CGA and cues for 10 reps each foot, stair negotiation 4 steps for 5 reps each, cues for big turns, CGA.   Other Exercises 2 Functional mobility with cues for amplitude of steps and reciprocal arm swing for 850 feet with one rest break required, outdoor surfaces with sloped areas, winding pathways, negotiation around obstacles with SBA and cues. Focused on reciprocal arm swing with cues for right arm, moderate cuing provided as well as modeling.                OT Education - 04/24/16 2120    Education provided Yes   Education Details adapted exercises, HEP   Person(s) Educated Patient   Methods  Explanation;Demonstration;Verbal cues   Comprehension Verbal cues required;Returned demonstration;Verbalized understanding             OT Long Term Goals - 04/16/16 1556    OT LONG TERM GOAL #1   Title Patient will improve gait speed and endurance and be able to walk 1350 feet in 6 minutes to negotiate around the home and community safely in 4 weeks   Baseline 6 minute walk test at evaluation 1225 feet   Time 4   Period Weeks   Status New   OT LONG TERM GOAL #2   Title Patient will complete HEP for maximal daily exercises with modified independence in 4 weeks     Baseline no current program   Time 4   Period Weeks   Status  New   OT LONG TERM GOAL #3   Title Patient will transfer from sit to stand without the use of arms safely and independently from a variety of chairs/surfaces in 4 weeks.   Baseline difficulty from lower surfaces   Time 4   Period Weeks   Status New   OT LONG TERM GOAL #4   Title Patient will decrease frequency of freezing episodes with score of 6 or less on Freezing of Gait questionnaire.      Baseline score of 7 on eval    Time 4   Period Weeks   Status New   OT LONG TERM GOAL #5   Title Patient will be able to obtain clothing from the dryer with modified independence.     Baseline difficulty at eval    Time 4   Period Weeks   Status New               Plan - 04/24/16 2121    Clinical Impression Statement Reinstruction on LSVT BIG maximal daily exercises in both standard version with therapist assist and adapted version to be performed at home as a part of home program.  Patient requires adapted version at home due to balance deficits. Patient able to complete with cues and modeling.  Continues to demo decreased right arm swing with functional mobility and needs additional reinforcement.    Rehab Potential Good   OT Frequency 4x / week   OT Duration 4 weeks   OT Treatment/Interventions Self-care/ADL training;Therapeutic exercise;Functional Mobility Training;Patient/family education;Neuromuscular education;Balance training;Therapeutic exercises;DME and/or AE instruction;Therapeutic activities;Gait Proofreader and Agree with Plan of Care Patient      Patient will benefit from skilled therapeutic intervention in order to improve the following deficits and impairments:  Abnormal gait, Improper body mechanics, Decreased knowledge of use of DME, Decreased strength, Impaired flexibility, Decreased balance, Decreased mobility, Difficulty walking, Decreased range of motion, Decreased coordination, Impaired UE functional use  Visit Diagnosis: Difficulty in  walking, not elsewhere classified  Unsteadiness on feet  Muscle weakness (generalized)  Other lack of coordination    Problem List Patient Active Problem List   Diagnosis Date Noted  . Parkinsonism ([place]) 05/04/2013  . Right arm pain 05/04/2013  . 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

## 2016-04-26 ENCOUNTER — Encounter: Payer: Self-pay | Admitting: Occupational Therapy

## 2016-04-26 NOTE — Therapy (Signed)
[place] MAIN [facility] SERVICES [address] [place], [place], [address] Phone: [phone]   Fax:  [phone]  Occupational Therapy Treatment  Patient Details  Name: Felicia Snyder MRN: [id number] Date of Birth: 1946-07-21 Referring Provider: Mervyn Skeeters  Encounter Date: 04/24/2016      OT End of Session - 04/25/16 1413    Visit Number 4   Number of Visits 17   Authorization Type Medicare G code 4   OT Start Time 1017   OT Stop Time 1115   OT Time Calculation (min) 58 min   Activity Tolerance Patient tolerated treatment well   Behavior During Therapy [facility] for tasks assessed/performed      Past Medical History  Diagnosis Date  . Premature osteoporosis   . Cancer [facility]) 1996    colon cancer    Past Surgical History  Procedure Laterality Date  . Ruptured disk  [id number]  . Broken ankle  2011    There were no vitals filed for this visit.      Subjective Assessment - 04/25/16 1410    Subjective  Patient reports she is doing well, has to go with husband to a doctor's appt this week in [place].   Patient Stated Goals Patient reports she wants to have better movement in the right arm, be independent.    Currently in Pain? No/denies   Multiple Pain Sites No                      OT Treatments/Exercises (OP) - 04/25/16 1411    ADLs   ADL Comments Functional component tasks as follows: 1) sit to stand, 2) reaching to the back of her head to perform hair care, 3) reaching down to shoes for tying, 4) fine motor coordination to thread a needle for sewing, 5) picking up and manipulation of small objects 1/2 inch in size or less, cues provided for BIG movements and technique.   Neurological Re-education Exercises   Other Exercises 1 Patient seen for LSVT BIG exercises: LSVT Daily Session Maximal Daily Exercises: Sustained movements are designed to rescale the amplitude of movement output for generalization to daily  functional activities. Performed as follows for 1 set of 10 repetitions each: Multi directional sustained movements- 1) Floor to ceiling, 2) Side to side. Multi directional Repetitive movements performed in standing and are designed to provide retraining effort needed for sustained muscle activation in tasks Performed as follows: 3) Step and reach forward, 4) Step and Reach Backwards, 5) Step and reach sideways, 6) Rock and reach forward/backward, 7) Rock and reach sideways. Sit to stand from mat table on lowest setting with cues for weight shift, technique and CGA for 10 reps for 1 set. Patient performing reciprocal stepping exercise with CGA and cues for 10 reps each foot, stair negotiation 4 steps for 5 reps each, cues for big turns, CGA.   Other Exercises 2 Functional mobility with cues for amplitude of steps and reciprocal arm swing for 1000  feet with one rest break required, outdoor surfaces with sloped areas, winding pathways, negotiation around obstacles with SBA and cues. Focused on reciprocal arm swing with cues for right arm, moderate cuing provided as well as modeling.                OT Education - 04/25/16 1413    Education provided Yes             OT Long Term Goals - 04/16/16 1556  OT LONG TERM GOAL #1   Title Patient will improve gait speed and endurance and be able to walk 1350 feet in 6 minutes to negotiate around the home and community safely in 4 weeks   Baseline 6 minute walk test at evaluation 1225 feet   Time 4   Period Weeks   Status New   OT LONG TERM GOAL #2   Title Patient will complete HEP for maximal daily exercises with modified independence in 4 weeks     Baseline no current program   Time 4   Period Weeks   Status New   OT LONG TERM GOAL #3   Title Patient will transfer from sit to stand without the use of arms safely and independently from a variety of chairs/surfaces in 4 weeks.   Baseline difficulty from lower surfaces   Time 4   Period  Weeks   Status New   OT LONG TERM GOAL #4   Title Patient will decrease frequency of freezing episodes with score of 6 or less on Freezing of Gait questionnaire.      Baseline score of 7 on eval    Time 4   Period Weeks   Status New   OT LONG TERM GOAL #5   Title Patient will be able to obtain clothing from the dryer with modified independence.     Baseline difficulty at eval    Time 4   Period Weeks   Status New               Plan - 04/26/16 1414    Clinical Impression Statement Patient has been able to implement adapted exercises at home and has been working on completing a second set each day.  She continues to require cues for reciprocal arm swing during functional mobility.     Rehab Potential Good   OT Frequency 4x / week   OT Duration 4 weeks   OT Treatment/Interventions Self-care/ADL training;Therapeutic exercise;Functional Mobility Training;Patient/family education;Neuromuscular education;Balance training;Therapeutic exercises;DME and/or AE instruction;Therapeutic activities;Gait Proofreader and Agree with Plan of Care Patient      Patient will benefit from skilled therapeutic intervention in order to improve the following deficits and impairments:  Abnormal gait, Improper body mechanics, Decreased knowledge of use of DME, Decreased strength, Impaired flexibility, Decreased balance, Decreased mobility, Difficulty walking, Decreased range of motion, Decreased coordination, Impaired UE functional use  Visit Diagnosis: Difficulty in walking, not elsewhere classified  Unsteadiness on feet  Muscle weakness (generalized)  Other lack of coordination    Problem List Patient Active Problem List   Diagnosis Date Noted  . Parkinsonism ([place]) 05/04/2013  . Right arm pain 05/04/2013  . 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

## 2016-04-26 NOTE — Therapy (Signed)
[place] MAIN [facility] SERVICES [address] [place], [place], [address] Phone: [phone]   Fax:  [phone]  Occupational Therapy Treatment  Patient Details  Name: Felicia Snyder MRN: [id number] Date of Birth: 17-Jul-1946 Referring Provider: Mervyn Skeeters  Encounter Date: 04/25/2016      OT End of Session - 04/26/16 1523    Visit Number 5   Number of Visits 17   Authorization Type Medicare G code 5   OT Start Time 1015   OT Stop Time 1115   OT Time Calculation (min) 60 min   Activity Tolerance Patient tolerated treatment well   Behavior During Therapy [facility] for tasks assessed/performed      Past Medical History  Diagnosis Date  . Premature osteoporosis   . Cancer [facility]) 1996    colon cancer    Past Surgical History  Procedure Laterality Date  . Ruptured disk  [id number]  . Broken ankle  2011    There were no vitals filed for this visit.      Subjective Assessment - 04/26/16 1519    Subjective  Patient reports she is aware she will need to perform exercises 2 times a day over the weekend.  She plans to continue with adapted version of exercises at home for balance.    Patient Stated Goals Patient reports she wants to have better movement in the right arm, be independent.    Currently in Pain? Yes   Pain Score 3    Pain Location Knee   Pain Orientation Right   Pain Onset Today   Aggravating Factors  "catching" sensation on the right at the knee.   Multiple Pain Sites No                      OT Treatments/Exercises (OP) - 04/26/16 1521    ADLs   ADL Comments Functional component tasks as follows: 1) sit to stand, 2) reaching to the back of her head to perform hair care, 3) reaching down to shoes for tying, 4) fine motor coordination to thread a needle for sewing, 5) picking up and manipulation of small objects 1/2 inch in size or less, cues provided for BIG movements and technique.   Neurological Re-education  Exercises   Other Exercises 1 Patient seen for LSVT BIG exercises: LSVT Daily Session Maximal Daily Exercises: Sustained movements are designed to rescale the amplitude of movement output for generalization to daily functional activities. Performed as follows for 1 set of 10 repetitions each: Multi directional sustained movements- 1) Floor to ceiling, 2) Side to side. Multi directional Repetitive movements performed in standing and are designed to provide retraining effort needed for sustained muscle activation in tasks Performed as follows: 3) Step and reach forward, 4) Step and Reach Backwards, 5) Step and reach sideways, 6) Rock and reach forward/backward, 7) Rock and reach sideways. Sit to stand from mat table on lowest setting with cues for weight shift, technique and CGA for 10 reps for 1 set. Patient performing reciprocal stepping exercise with CGA and cues for 10 reps each foot, stair negotiation 4 steps for 5 reps each, cues for big turns, CGA.Marland Kitchen  All maximal daily exercises completed in standard version with therapist assist for balance and adapted version with use of chair.   Other Exercises 2 Functional mobility with cues for amplitude of steps and reciprocal arm swing for 1000 feet with one rest break required, outdoor surfaces with sloped areas, winding pathways, negotiation around  obstacles with SBA and cues. Focused on reciprocal arm swing with cues for right arm, moderate cuing provided as well as modeling.                OT Education - 04/26/16 1523    Education provided Yes   Education Details HEP   Person(s) Educated Patient   Methods Explanation;Tactile cues;Demonstration;Verbal cues   Comprehension Verbal cues required;Returned demonstration;Verbalized understanding             OT Long Term Goals - 04/16/16 1556    OT LONG TERM GOAL #1   Title Patient will improve gait speed and endurance and be able to walk 1350 feet in 6 minutes to negotiate around the home and  community safely in 4 weeks   Baseline 6 minute walk test at evaluation 1225 feet   Time 4   Period Weeks   Status New   OT LONG TERM GOAL #2   Title Patient will complete HEP for maximal daily exercises with modified independence in 4 weeks     Baseline no current program   Time 4   Period Weeks   Status New   OT LONG TERM GOAL #3   Title Patient will transfer from sit to stand without the use of arms safely and independently from a variety of chairs/surfaces in 4 weeks.   Baseline difficulty from lower surfaces   Time 4   Period Weeks   Status New   OT LONG TERM GOAL #4   Title Patient will decrease frequency of freezing episodes with score of 6 or less on Freezing of Gait questionnaire.      Baseline score of 7 on eval    Time 4   Period Weeks   Status New   OT LONG TERM GOAL #5   Title Patient will be able to obtain clothing from the dryer with modified independence.     Baseline difficulty at eval    Time 4   Period Weeks   Status New               Plan - 04/26/16 1523    Clinical Impression Statement Patient has completed her first week of intensive LSVT BIG program for patients with Parkinson's disease.  She demonstrates balance deficits during functional movement patterns and has required adapted exercises this week to perform at home.  She responds well to cues for reciprocal arm swing with ambulation. Will continue to work towards improved balance, mobility, posture and functional tasks.    Rehab Potential Good   OT Frequency 4x / week   OT Duration 4 weeks   OT Treatment/Interventions Self-care/ADL training;Therapeutic exercise;Functional Mobility Training;Patient/family education;Neuromuscular education;Balance training;Therapeutic exercises;DME and/or AE instruction;Therapeutic activities;Gait Proofreader and Agree with Plan of Care Patient      Patient will benefit from skilled therapeutic intervention in order to improve the  following deficits and impairments:  Abnormal gait, Improper body mechanics, Decreased knowledge of use of DME, Decreased strength, Impaired flexibility, Decreased balance, Decreased mobility, Difficulty walking, Decreased range of motion, Decreased coordination, Impaired UE functional use  Visit Diagnosis: Difficulty in walking, not elsewhere classified  Unsteadiness on feet  Muscle weakness (generalized)  Other lack of coordination    Problem List Patient Active Problem List   Diagnosis Date Noted  . Parkinsonism ([place]) 05/04/2013  . Right arm pain 05/04/2013  . 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

## 2016-04-29 ENCOUNTER — Encounter: Payer: Self-pay | Admitting: Occupational Therapy

## 2016-04-29 ENCOUNTER — Ambulatory Visit: Payer: Medicare HMO | Admitting: Occupational Therapy

## 2016-04-29 DIAGNOSIS — R278 Other lack of coordination: Secondary | ICD-10-CM

## 2016-04-29 DIAGNOSIS — R262 Difficulty in walking, not elsewhere classified: Secondary | ICD-10-CM | POA: Diagnosis not present

## 2016-04-29 DIAGNOSIS — M6281 Muscle weakness (generalized): Secondary | ICD-10-CM

## 2016-04-29 DIAGNOSIS — R2681 Unsteadiness on feet: Secondary | ICD-10-CM

## 2016-04-30 ENCOUNTER — Ambulatory Visit: Payer: Medicare HMO | Admitting: Occupational Therapy

## 2016-04-30 DIAGNOSIS — R278 Other lack of coordination: Secondary | ICD-10-CM

## 2016-04-30 DIAGNOSIS — R2681 Unsteadiness on feet: Secondary | ICD-10-CM

## 2016-04-30 DIAGNOSIS — M6281 Muscle weakness (generalized): Secondary | ICD-10-CM

## 2016-04-30 DIAGNOSIS — R262 Difficulty in walking, not elsewhere classified: Secondary | ICD-10-CM | POA: Diagnosis not present

## 2016-04-30 NOTE — Therapy (Signed)
[place] MAIN [facility] SERVICES [address] [place], [place], [address] Phone: [phone]   Fax:  [phone]  Occupational Therapy Treatment  Patient Details  Name: Felicia Snyder MRN: [id number] Date of Birth: October 18, 1946 Referring Provider: Mervyn Skeeters  Encounter Date: 04/29/2016      OT End of Session - 04/30/16 1536    Visit Number 6   Number of Visits 17   Authorization Type Medicare G code 6   OT Start Time 1015   OT Stop Time 1114   OT Time Calculation (min) 59 min   Activity Tolerance Patient tolerated treatment well   Behavior During Therapy [facility] for tasks assessed/performed      Past Medical History  Diagnosis Date  . Premature osteoporosis   . Cancer [facility]) 1996    colon cancer    Past Surgical History  Procedure Laterality Date  . Ruptured disk  [id number]  . Broken ankle  2011    There were no vitals filed for this visit.      Subjective Assessment - 04/29/16 1043    Subjective  Patient reports she felt her right leg felt dragging and tired this weekend, not sure why.     Patient Stated Goals Patient reports she wants to have better movement in the right arm, be independent.    Currently in Pain? No/denies   Pain Score 0-No pain                      OT Treatments/Exercises (OP) - 04/29/16 1532    ADLs   ADL Comments Functional component tasks as follows: 1) sit to stand, 2) reaching to the back of her head to perform hair care, 3) reaching down to shoes for tying, 4) fine motor coordination to thread a needle for sewing, 5) picking up and manipulation of small objects 1/2 inch in size or less, cues provided for BIG movements and technique. Added crossed leg method for reaching to shoes for tying.     Neurological Re-education Exercises   Other Exercises 1 Patient seen for LSVT BIG exercises: LSVT Daily Session Maximal Daily Exercises: Sustained movements are designed to rescale the amplitude of  movement output for generalization to daily functional activities. Performed as follows for 1 set of 10 repetitions each: Multi directional sustained movements- 1) Floor to ceiling, 2) Side to side. Multi directional Repetitive movements performed in standing and are designed to provide retraining effort needed for sustained muscle activation in tasks Performed as follows: 3) Step and reach forward, 4) Step and Reach Backwards, 5) Step and reach sideways, 6) Rock and reach forward/backward, 7) Rock and reach sideways. Sit to stand from mat table on lowest setting with cues for weight shift, technique and SBA for 10 reps for 1 set. Patient performing reciprocal stepping exercise with SBA and cues for 10 reps each foot, stair negotiation 4 steps for 5 reps each, cues for big turns, SBA. All maximal daily exercises completed in standard version with therapist assist for balance. Added hand flicks to exercises in sitting this date and will progressed as she adjusts this week.    Other Exercises 2 Functional mobility with cues for amplitude of steps and reciprocal arm swing for 950 feet with one rest break required, outdoor surfaces with sloped areas, winding pathways, negotiation around obstacles with SBA and cues. Focused on reciprocal arm swing with cues for right arm, moderate cuing provided as well as modeling.  OT Education - 04/30/16 1536    Education provided Yes   Education Details maximal daily exercises, balance, hand flicks to advance exercises    Person(s) Educated Patient   Methods Explanation;Demonstration;Verbal cues   Comprehension Verbal cues required;Verbalized understanding;Returned demonstration             OT Long Term Goals - 04/16/16 1556    OT LONG TERM GOAL #1   Title Patient will improve gait speed and endurance and be able to walk 1350 feet in 6 minutes to negotiate around the home and community safely in 4 weeks   Baseline 6 minute walk test at  evaluation 1225 feet   Time 4   Period Weeks   Status New   OT LONG TERM GOAL #2   Title Patient will complete HEP for maximal daily exercises with modified independence in 4 weeks     Baseline no current program   Time 4   Period Weeks   Status New   OT LONG TERM GOAL #3   Title Patient will transfer from sit to stand without the use of arms safely and independently from a variety of chairs/surfaces in 4 weeks.   Baseline difficulty from lower surfaces   Time 4   Period Weeks   Status New   OT LONG TERM GOAL #4   Title Patient will decrease frequency of freezing episodes with score of 6 or less on Freezing of Gait questionnaire.      Baseline score of 7 on eval    Time 4   Period Weeks   Status New   OT LONG TERM GOAL #5   Title Patient will be able to obtain clothing from the dryer with modified independence.     Baseline difficulty at eval    Time 4   Period Weeks   Status New               Plan - 04/30/16 1537    Clinical Impression Statement Patient continues to progress in all areas and able to advance to adding hand flicks to select maximal daily exercises this date.  She continues to demo diminished reciprocal arm swing on the right and responds to cues however, when she becomes distracted it is difficult to keep the arm in movement. Continue to reinforce.    Rehab Potential Good   OT Frequency 4x / week   OT Duration 4 weeks   OT Treatment/Interventions Self-care/ADL training;Therapeutic exercise;Functional Mobility Training;Patient/family education;Neuromuscular education;Balance training;Therapeutic exercises;DME and/or AE instruction;Therapeutic activities;Gait Proofreader and Agree with Plan of Care Patient      Patient will benefit from skilled therapeutic intervention in order to improve the following deficits and impairments:  Abnormal gait, Improper body mechanics, Decreased knowledge of use of DME, Decreased strength, Impaired  flexibility, Decreased balance, Decreased mobility, Difficulty walking, Decreased range of motion, Decreased coordination, Impaired UE functional use  Visit Diagnosis: Difficulty in walking, not elsewhere classified  Unsteadiness on feet  Muscle weakness (generalized)  Other lack of coordination    Problem List Patient Active Problem List   Diagnosis Date Noted  . Parkinsonism ([place]) 05/04/2013  . Right arm pain 05/04/2013  . 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

## 2016-05-01 ENCOUNTER — Ambulatory Visit: Payer: Medicare HMO | Admitting: Occupational Therapy

## 2016-05-01 DIAGNOSIS — R262 Difficulty in walking, not elsewhere classified: Secondary | ICD-10-CM

## 2016-05-01 DIAGNOSIS — M6281 Muscle weakness (generalized): Secondary | ICD-10-CM

## 2016-05-01 DIAGNOSIS — R278 Other lack of coordination: Secondary | ICD-10-CM

## 2016-05-01 DIAGNOSIS — R2681 Unsteadiness on feet: Secondary | ICD-10-CM

## 2016-05-02 ENCOUNTER — Ambulatory Visit: Payer: Medicare HMO | Admitting: Occupational Therapy

## 2016-05-02 DIAGNOSIS — R2681 Unsteadiness on feet: Secondary | ICD-10-CM

## 2016-05-02 DIAGNOSIS — R262 Difficulty in walking, not elsewhere classified: Secondary | ICD-10-CM | POA: Diagnosis not present

## 2016-05-02 DIAGNOSIS — R278 Other lack of coordination: Secondary | ICD-10-CM

## 2016-05-02 DIAGNOSIS — M6281 Muscle weakness (generalized): Secondary | ICD-10-CM

## 2016-05-03 ENCOUNTER — Encounter: Payer: Self-pay | Admitting: Occupational Therapy

## 2016-05-03 ENCOUNTER — Encounter: Payer: Medicare Other | Admitting: Occupational Therapy

## 2016-05-03 DIAGNOSIS — J309 Allergic rhinitis, unspecified: Secondary | ICD-10-CM | POA: Insufficient documentation

## 2016-05-03 DIAGNOSIS — M810 Age-related osteoporosis without current pathological fracture: Secondary | ICD-10-CM | POA: Insufficient documentation

## 2016-05-03 DIAGNOSIS — B009 Herpesviral infection, unspecified: Secondary | ICD-10-CM | POA: Insufficient documentation

## 2016-05-03 DIAGNOSIS — Z85820 Personal history of malignant melanoma of skin: Secondary | ICD-10-CM | POA: Insufficient documentation

## 2016-05-03 NOTE — Therapy (Signed)
[place] MAIN [facility] SERVICES [address] [place], [place], [address] Phone: [phone]   Fax:  [phone]  Occupational Therapy Treatment  Patient Details  Name: Maytee Linenberger MRN: [id number] Date of Birth: 1945/12/28 Referring Provider: Mervyn Skeeters  Encounter Date: 05/01/2016      OT End of Session - 05/03/16 1700    Visit Number 8   Number of Visits 17   Authorization Type Medicare G code 8   OT Start Time 1025   OT Stop Time 1120   OT Time Calculation (min) 55 min   Activity Tolerance Patient tolerated treatment well   Behavior During Therapy [facility] for tasks assessed/performed      Past Medical History  Diagnosis Date  . Premature osteoporosis   . Cancer [facility]) 1996    colon cancer    Past Surgical History  Procedure Laterality Date  . Ruptured disk  [id number]  . Broken ankle  2011  . Colectomy    . Tooth extraction      There were no vitals filed for this visit.      Subjective Assessment - 05/03/16 1655    Subjective  Patient reports she is doing exercises at home in addition to in the clinic, she feels she is doing well.    Patient Stated Goals Patient reports she wants to have better movement in the right arm, be independent.    Currently in Pain? No/denies   Pain Score 0-No pain                      OT Treatments/Exercises (OP) - 05/03/16 1655    ADLs   ADL Comments Functional component tasks as follows: 1) sit to stand, 2) reaching to the back of her head to perform hair care, 3) reaching down to shoes for tying, 4) fine motor coordination to thread a needle for sewing, 5) picking up and manipulation of small objects 1/2 inch in size or less, cues provided for BIG movements and technique. Added crossed leg method for reaching to shoes for tying. Postural exercises with wall stretches with cues.    Neurological Re-education Exercises   Other Exercises 1 Patient seen for LSVT BIG exercises: LSVT  Daily Session Maximal Daily Exercises: Sustained movements are designed to rescale the amplitude of movement output for generalization to daily functional activities. Performed as follows for 1 set of 10 repetitions each: Multi directional sustained movements- 1) Floor to ceiling, 2) Side to side. Multi directional Repetitive movements performed in standing and are designed to provide retraining effort needed for sustained muscle activation in tasks Performed as follows: 3) Step and reach forward, 4) Step and Reach Backwards, 5) Step and reach sideways, 6) Rock and reach forward/backward, 7) Rock and reach sideways. Sit to stand from mat table on lowest setting with cues for weight shift, technique and SBA for 10 reps for 1 set. Patient performing reciprocal stepping exercise with SBA and cues for 10 reps each foot, stair negotiation 4 steps for 5 reps each, cues for big turns, SBA. All maximal daily exercises completed in standard version with therapist assist for balance. Hand flicks to exercises in sitting with cues for technique.     Other Exercises 2 Functional mobility with cues for amplitude of steps and reciprocal arm swing for 1000 feet with one rest break required, outdoor surfaces with sloped areas, winding pathways, negotiation around obstacles with SBA and cues. Focused on reciprocal arm swing with cues for  right arm, moderate cuing provided as well as modeling..Balance tasks in standing on balance pad with ball toss, CGA                OT Education - 05/03/16 1700    Education provided Yes   Education Details maximal daily exercises, HEP, balance tasks, hand flicks   Person(s) Educated Patient   Methods Explanation;Demonstration;Verbal cues   Comprehension Verbal cues required;Returned demonstration;Verbalized understanding             OT Long Term Goals - 04/16/16 1556    OT LONG TERM GOAL #1   Title Patient will improve gait speed and endurance and be able to walk 1350  feet in 6 minutes to negotiate around the home and community safely in 4 weeks   Baseline 6 minute walk test at evaluation 1225 feet   Time 4   Period Weeks   Status New   OT LONG TERM GOAL #2   Title Patient will complete HEP for maximal daily exercises with modified independence in 4 weeks     Baseline no current program   Time 4   Period Weeks   Status New   OT LONG TERM GOAL #3   Title Patient will transfer from sit to stand without the use of arms safely and independently from a variety of chairs/surfaces in 4 weeks.   Baseline difficulty from lower surfaces   Time 4   Period Weeks   Status New   OT LONG TERM GOAL #4   Title Patient will decrease frequency of freezing episodes with score of 6 or less on Freezing of Gait questionnaire.      Baseline score of 7 on eval    Time 4   Period Weeks   Status New   OT LONG TERM GOAL #5   Title Patient will be able to obtain clothing from the dryer with modified independence.     Baseline difficulty at eval    Time 4   Period Weeks   Status New               Plan - 05/03/16 1701    Clinical Impression Statement Patient able to complete exercises this date with chair by her side but not using it.  Therapist provided assist as needed, still has some slight difficulty with stepping forwards and balance, mostly due to stepping patterns and need to adjust for wider base of support.  Will continue to challenge patient and work towards calibration of movement patterns with added distractions and grading acts as needed.    Rehab Potential Good   OT Frequency 4x / week   OT Duration 4 weeks   OT Treatment/Interventions Self-care/ADL training;Therapeutic exercise;Functional Mobility Training;Patient/family education;Neuromuscular education;Balance training;Therapeutic exercises;DME and/or AE instruction;Therapeutic activities;Gait Proofreader and Agree with Plan of Care Patient      Patient will benefit from  skilled therapeutic intervention in order to improve the following deficits and impairments:  Abnormal gait, Improper body mechanics, Decreased knowledge of use of DME, Decreased strength, Impaired flexibility, Decreased balance, Decreased mobility, Difficulty walking, Decreased range of motion, Decreased coordination, Impaired UE functional use  Visit Diagnosis: Difficulty in walking, not elsewhere classified  Unsteadiness on feet  Muscle weakness (generalized)  Other lack of coordination    Problem List Patient Active Problem List   Diagnosis Date Noted  . Allergic rhinitis 05/03/2016  . Herpes simplex 05/03/2016  . 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 OP (osteoporosis)  05/03/2016  . Acute myocardial infarction ([place]) 10/02/2015  . History of cardiac catheterization 10/02/2015  . Incomplete uterine prolapse 12/27/2014  . Chronic constipation 12/26/2014  . H/O malignant neoplasm of colon 10/13/2014  . Parkinsonism ([place]) 05/04/2013  . Right arm pain 05/04/2013  . 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 Abnormal reflex 05/04/2013  . Parkinson's disease ([place]) 05/04/2013   Holliday Sheaffer T Tomasita Morrow, OTR/L, CLT  Elvera Almario 05/03/2016, 5:04 PM  [place] MAIN [facility] SERVICES [address] [place], [place], [address] Phone: [phone]   Fax:  [phone]  Name: Pearla Holtsclaw MRN: [id number] Date of Birth: 12-15-1945

## 2016-05-03 NOTE — Therapy (Signed)
[place] MAIN [facility] SERVICES [address] [place], [place], [address] Phone: [phone]   Fax:  [phone]  Occupational Therapy Treatment  Patient Details  Name: Felicia Snyder MRN: [id number] Date of Birth: 1946/01/13 Referring Provider: Mervyn Skeeters  Encounter Date: 04/30/2016      OT End of Session - 05/02/16 1629    Visit Number 7   Number of Visits 17   Authorization Type Medicare G code 7   OT Start Time 1400   OT Stop Time 1500   OT Time Calculation (min) 60 min   Activity Tolerance Patient tolerated treatment well   Behavior During Therapy [facility] for tasks assessed/performed      Past Medical History  Diagnosis Date  . Premature osteoporosis   . Cancer [facility]) 1996    colon cancer    Past Surgical History  Procedure Laterality Date  . Ruptured disk  [id number]  . Broken ankle  2011  . Colectomy    . Tooth extraction      There were no vitals filed for this visit.      Subjective Assessment - 05/02/16 1627    Subjective  Patient reports she is doing well, implemented the hand flicks into her first 2 exercises and can tell a difference.     Patient Stated Goals Patient reports she wants to have better movement in the right arm, be independent.                       OT Treatments/Exercises (OP) - 05/02/16 1633    ADLs   ADL Comments Functional component tasks as follows: 1) sit to stand, 2) reaching to the back of her head to perform hair care, 3) reaching down to shoes for tying, 4) fine motor coordination to thread a needle for sewing, 5) picking up and manipulation of small objects 1/2 inch in size or less, cues provided for BIG movements and technique. Added crossed leg method for reaching to shoes for tying.  Postural exercises with wall stretches with cues.    Neurological Re-education Exercises   Other Exercises 1 Patient seen for LSVT BIG exercises: LSVT Daily Session Maximal Daily Exercises:  Sustained movements are designed to rescale the amplitude of movement output for generalization to daily functional activities. Performed as follows for 1 set of 10 repetitions each: Multi directional sustained movements- 1) Floor to ceiling, 2) Side to side. Multi directional Repetitive movements performed in standing and are designed to provide retraining effort needed for sustained muscle activation in tasks Performed as follows: 3) Step and reach forward, 4) Step and Reach Backwards, 5) Step and reach sideways, 6) Rock and reach forward/backward, 7) Rock and reach sideways. Sit to stand from mat table on lowest setting with cues for weight shift, technique and SBA for 10 reps for 1 set. Patient performing reciprocal stepping exercise with SBA and cues for 10 reps each foot, stair negotiation 4 steps for 5 reps each, cues for big turns, SBA. All maximal daily exercises completed in standard version with therapist assist for balance. Hand flicks to exercises in sitting this date and will progressed as she adjusts this week.    Other Exercises 2 Functional mobility with cues for amplitude of steps and reciprocal arm swing for 1000 feet with one rest break required, outdoor surfaces with sloped areas, winding pathways, negotiation around obstacles with SBA and cues. Focused on reciprocal arm swing with cues for right arm, moderate cuing provided  as well as modeling..  Balance exercises in standing with balance pad and cues.                OT Education - 05/02/16 1628    Education provided Yes   Education Details HEP   Person(s) Educated Patient   Methods Explanation;Demonstration;Verbal cues   Comprehension Verbal cues required;Returned demonstration;Verbalized understanding             OT Long Term Goals - 04/16/16 1556    OT LONG TERM GOAL #1   Title Patient will improve gait speed and endurance and be able to walk 1350 feet in 6 minutes to negotiate around the home and community  safely in 4 weeks   Baseline 6 minute walk test at evaluation 1225 feet   Time 4   Period Weeks   Status New   OT LONG TERM GOAL #2   Title Patient will complete HEP for maximal daily exercises with modified independence in 4 weeks     Baseline no current program   Time 4   Period Weeks   Status New   OT LONG TERM GOAL #3   Title Patient will transfer from sit to stand without the use of arms safely and independently from a variety of chairs/surfaces in 4 weeks.   Baseline difficulty from lower surfaces   Time 4   Period Weeks   Status New   OT LONG TERM GOAL #4   Title Patient will decrease frequency of freezing episodes with score of 6 or less on Freezing of Gait questionnaire.      Baseline score of 7 on eval    Time 4   Period Weeks   Status New   OT LONG TERM GOAL #5   Title Patient will be able to obtain clothing from the dryer with modified independence.     Baseline difficulty at eval    Time 4   Period Weeks   Status New               Plan - 05/02/16 1629    Clinical Impression Statement Patient progressing with exercises and using the chair less for balance at home, still has some problems with exercise of stepping forwards with balance and stance but responds well to cues.  Incorporating more balance tasks in the clinic this week in addition to current program.  Added postural exercises as well.  She continues to require cues for reciprocal arm swing with functional mobility tasks.    Rehab Potential Good   OT Frequency 4x / week   OT Duration 4 weeks   OT Treatment/Interventions Self-care/ADL training;Therapeutic exercise;Functional Mobility Training;Patient/family education;Neuromuscular education;Balance training;Therapeutic exercises;DME and/or AE instruction;Therapeutic activities;Gait Proofreader and Agree with Plan of Care Patient      Patient will benefit from skilled therapeutic intervention in order to improve the  following deficits and impairments:  Abnormal gait, Improper body mechanics, Decreased knowledge of use of DME, Decreased strength, Impaired flexibility, Decreased balance, Decreased mobility, Difficulty walking, Decreased range of motion, Decreased coordination, Impaired UE functional use  Visit Diagnosis: Difficulty in walking, not elsewhere classified  Unsteadiness on feet  Muscle weakness (generalized)  Other lack of coordination    Problem List Patient Active Problem List   Diagnosis Date Noted  . Allergic rhinitis 05/03/2016  . Herpes simplex 05/03/2016  . 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 OP (osteoporosis) 05/03/2016  . Acute myocardial infarction ([place]) 10/02/2015  . History of cardiac  catheterization 10/02/2015  . Incomplete uterine prolapse 12/27/2014  . Chronic constipation 12/26/2014  . H/O malignant neoplasm of colon 10/13/2014  . Parkinsonism ([place]) 05/04/2013  . Right arm pain 05/04/2013  . 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 Abnormal reflex 05/04/2013  . Parkinson's disease ([place]) 05/04/2013   Amy T Tomasita Morrow, OTR/L, CLT   Lovett,Amy 05/03/2016, 4:36 PM  [place] MAIN [facility] SERVICES [address] [place], [place], [address] Phone: [phone]   Fax:  [phone]  Name: Felicia Snyder MRN: [id number] Date of Birth: 12-23-1945

## 2016-05-03 NOTE — Therapy (Signed)
[place] MAIN [facility] SERVICES [address] [place], [place], [address] Phone: [phone]   Fax:  [phone]  Occupational Therapy Treatment  Patient Details  Name: Felicia Snyder MRN: [id number] Date of Birth: May 04, 1946 Referring Provider: Mervyn Skeeters  Encounter Date: 05/02/2016      OT End of Session - 05/03/16 1736    Visit Number 9   Number of Visits 17   Authorization Type Medicare G code 9   OT Start Time 0845   OT Stop Time 0940   OT Time Calculation (min) 55 min   Activity Tolerance Patient tolerated treatment well   Behavior During Therapy [facility] for tasks assessed/performed      Past Medical History  Diagnosis Date  . Premature osteoporosis   . Cancer [facility]) 1996    colon cancer    Past Surgical History  Procedure Laterality Date  . Ruptured disk  [id number]  . Broken ankle  2011  . Colectomy    . Tooth extraction      There were no vitals filed for this visit.      Subjective Assessment - 05/03/16 1732    Subjective  Patient reports she is going to [place] to visit her family for the holiday weekend.    Patient Stated Goals Patient reports she wants to have better movement in the right arm, be independent.    Currently in Pain? No/denies   Pain Score 0-No pain                      OT Treatments/Exercises (OP) - 05/03/16 1733    ADLs   ADL Comments Functional component tasks as follows: 1) sit to stand, 2) reaching to the back of her head to perform hair care, 3) reaching down to shoes for tying, 4) fine motor coordination to thread a needle for sewing, 5) picking up and manipulation of small objects 1/2 inch in size or less, cues provided for BIG movements and technique. Crossed leg method for reaching to shoes for tying. Postural exercises with wall stretches with cues.   Neurological Re-education Exercises   Other Exercises 1 Patient seen for LSVT BIG exercises: LSVT Daily Session Maximal  Daily Exercises: Sustained movements are designed to rescale the amplitude of movement output for generalization to daily functional activities. Performed as follows for 1 set of 10 repetitions each: Multi directional sustained movements- 1) Floor to ceiling, 2) Side to side. Multi directional Repetitive movements performed in standing and are designed to provide retraining effort needed for sustained muscle activation in tasks Performed as follows: 3) Step and reach forward, 4) Step and Reach Backwards, 5) Step and reach sideways, 6) Rock and reach forward/backward, 7) Rock and reach sideways. Sit to stand from mat table on lowest setting with cues for weight shift, technique and SBA for 10 reps for 1 set. Patient performing reciprocal stepping exercise with SBA and cues for 10 reps each foot, stair negotiation 4 steps for 5 reps each, cues for big turns, SBA. All maximal daily exercises completed in standard version with therapist assist for balance, did not use chair this date. Hand flicks to exercises in sitting with cues for technique.   Other Exercises 2 Functional mobility with cues for amplitude of steps and reciprocal arm swing for 1050 feet with one rest break required, outdoor surfaces with sloped areas, winding pathways, negotiation around obstacles with SBA and cues. Focused on reciprocal arm swing with cues for right arm, moderate  cuing provided as well as modeling.  Balance tasks in standing on balance pad with ball toss, CGA                OT Education - 05/03/16 1736    Education provided Yes   Education Details HEP, balance   Person(s) Educated Patient   Methods Explanation;Demonstration;Verbal cues   Comprehension Verbal cues required;Returned demonstration;Verbalized understanding             OT Long Term Goals - 04/16/16 1556    OT LONG TERM GOAL #1   Title Patient will improve gait speed and endurance and be able to walk 1350 feet in 6 minutes to negotiate around  the home and community safely in 4 weeks   Baseline 6 minute walk test at evaluation 1225 feet   Time 4   Period Weeks   Status New   OT LONG TERM GOAL #2   Title Patient will complete HEP for maximal daily exercises with modified independence in 4 weeks     Baseline no current program   Time 4   Period Weeks   Status New   OT LONG TERM GOAL #3   Title Patient will transfer from sit to stand without the use of arms safely and independently from a variety of chairs/surfaces in 4 weeks.   Baseline difficulty from lower surfaces   Time 4   Period Weeks   Status New   OT LONG TERM GOAL #4   Title Patient will decrease frequency of freezing episodes with score of 6 or less on Freezing of Gait questionnaire.      Baseline score of 7 on eval    Time 4   Period Weeks   Status New   OT LONG TERM GOAL #5   Title Patient will be able to obtain clothing from the dryer with modified independence.     Baseline difficulty at eval    Time 4   Period Weeks   Status New               Plan - 05/03/16 1736    Clinical Impression Statement Patient has improved over the last week and did not use the chair for balance this date, cues for base of support when performing stepping forwards.  Will plan to reassess patient outcome measures next session and update goals and plan of care.  Patient continues to benefit from skilled OT to increase independence in daily tasks.    Rehab Potential Good   OT Frequency 4x / week   OT Duration 4 weeks   OT Treatment/Interventions Self-care/ADL training;Therapeutic exercise;Functional Mobility Training;Patient/family education;Neuromuscular education;Balance training;Therapeutic exercises;DME and/or AE instruction;Therapeutic activities;Gait Proofreader and Agree with Plan of Care Patient      Patient will benefit from skilled therapeutic intervention in order to improve the following deficits and impairments:  Abnormal gait,  Improper body mechanics, Decreased knowledge of use of DME, Decreased strength, Impaired flexibility, Decreased balance, Decreased mobility, Difficulty walking, Decreased range of motion, Decreased coordination, Impaired UE functional use  Visit Diagnosis: Difficulty in walking, not elsewhere classified  Unsteadiness on feet  Muscle weakness (generalized)  Other lack of coordination    Problem List Patient Active Problem List   Diagnosis Date Noted  . Allergic rhinitis 05/03/2016  . Herpes simplex 05/03/2016  . 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 OP (osteoporosis) 05/03/2016  . Acute myocardial infarction ([place]) 10/02/2015  . History of cardiac catheterization 10/02/2015  .  Incomplete uterine prolapse 12/27/2014  . Chronic constipation 12/26/2014  . H/O malignant neoplasm of colon 10/13/2014  . Parkinsonism ([place]) 05/04/2013  . Right arm pain 05/04/2013  . 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 Abnormal reflex 05/04/2013  . Parkinson's disease ([place]) 05/04/2013   Amy T Tomasita Morrow, OTR/L, CLT  Lovett,Amy 05/03/2016, 5:39 PM  [place] MAIN [facility] SERVICES [address] [place], [place], [address] Phone: [phone]   Fax:  [phone]  Name: Anvika Norrick MRN: [id number] Date of Birth: 10-14-1946

## 2016-05-06 ENCOUNTER — Encounter: Payer: Medicare Other | Admitting: Occupational Therapy

## 2016-05-08 ENCOUNTER — Ambulatory Visit: Payer: Medicare HMO | Attending: Psychiatry | Admitting: Occupational Therapy

## 2016-05-08 DIAGNOSIS — M6281 Muscle weakness (generalized): Secondary | ICD-10-CM | POA: Insufficient documentation

## 2016-05-08 DIAGNOSIS — R2681 Unsteadiness on feet: Secondary | ICD-10-CM

## 2016-05-08 DIAGNOSIS — R262 Difficulty in walking, not elsewhere classified: Secondary | ICD-10-CM | POA: Diagnosis not present

## 2016-05-08 DIAGNOSIS — R278 Other lack of coordination: Secondary | ICD-10-CM | POA: Diagnosis not present

## 2016-05-09 ENCOUNTER — Ambulatory Visit: Payer: Medicare HMO | Admitting: Occupational Therapy

## 2016-05-09 DIAGNOSIS — R262 Difficulty in walking, not elsewhere classified: Secondary | ICD-10-CM | POA: Diagnosis not present

## 2016-05-09 DIAGNOSIS — R2681 Unsteadiness on feet: Secondary | ICD-10-CM

## 2016-05-09 DIAGNOSIS — R278 Other lack of coordination: Secondary | ICD-10-CM

## 2016-05-09 DIAGNOSIS — M6281 Muscle weakness (generalized): Secondary | ICD-10-CM

## 2016-05-10 ENCOUNTER — Encounter: Payer: Self-pay | Admitting: Occupational Therapy

## 2016-05-10 ENCOUNTER — Ambulatory Visit: Payer: Medicare HMO | Admitting: Occupational Therapy

## 2016-05-10 DIAGNOSIS — R278 Other lack of coordination: Secondary | ICD-10-CM

## 2016-05-10 DIAGNOSIS — R262 Difficulty in walking, not elsewhere classified: Secondary | ICD-10-CM | POA: Diagnosis not present

## 2016-05-10 DIAGNOSIS — M6281 Muscle weakness (generalized): Secondary | ICD-10-CM

## 2016-05-10 DIAGNOSIS — R2681 Unsteadiness on feet: Secondary | ICD-10-CM

## 2016-05-10 NOTE — Therapy (Signed)
[place] MAIN [facility] SERVICES [address] [place], [place], [address] Phone: [phone]   Fax:  [phone]  Occupational Therapy Treatment  Patient Details  Name: Felicia Snyder MRN: [id number] Date of Birth: 07-28-1946 Referring Provider: Mervyn Skeeters  Encounter Date: 05/09/2016      OT End of Session - 05/10/16 1140    Visit Number 11   Number of Visits 17   Authorization Type Medicare G code 11   OT Start Time 1015   OT Stop Time 1112   OT Time Calculation (min) 57 min   Activity Tolerance Patient tolerated treatment well   Behavior During Therapy [facility] for tasks assessed/performed      Past Medical History  Diagnosis Date  . Premature osteoporosis   . Cancer [facility]) 1996    colon cancer    Past Surgical History  Procedure Laterality Date  . Ruptured disk  [id number]  . Broken ankle  2011  . Colectomy    . Tooth extraction      There were no vitals filed for this visit.      Subjective Assessment - 05/10/16 1139    Subjective  Patient reports she is working on some sewing tasks this week.  "I just do a little bit at a time."   Patient Stated Goals Patient reports she wants to have better movement in the right arm, be independent.    Currently in Pain? No/denies                      OT Treatments/Exercises (OP) - 05/10/16 1142    ADLs   ADL Comments Functional component tasks as follows: 1) sit to stand, 2) reaching to the back of her head to perform hair care, 3) reaching down to shoes for tying, 4) fine motor coordination to thread a needle for sewing, 5) picking up and manipulation of small objects 1/2 inch in size or less, cues provided for BIG movements and technique. Crossed leg method for reaching to shoes for tying. Postural exercises with wall stretches with cues.   Neurological Re-education Exercises   Other Exercises 1 Patient seen for LSVT BIG exercises: LSVT Daily Session Maximal Daily  Exercises: Sustained movements are designed to rescale the amplitude of movement output for generalization to daily functional activities. Performed as follows for 1 set of 10 repetitions each: Multi directional sustained movements- 1) Floor to ceiling, 2) Side to side. Multi directional Repetitive movements performed in standing and are designed to provide retraining effort needed for sustained muscle activation in tasks Performed as follows: 3) Step and reach forward, 4) Step and Reach Backwards, 5) Step and reach sideways, 6) Rock and reach forward/backward, 7) Rock and reach sideways. Sit to stand from mat table on lowest setting with cues for weight shift, technique and SBA for 10 reps for 1 set. Patient performing reciprocal stepping exercise with SBA and cues for 10 reps each foot, stair negotiation 4 steps for 5 reps each, cues for big turns, SBA. All maximal daily exercises completed in standard version with therapist assist for balance. Hand flicks to exercises in sitting with cues for technique.   Other Exercises 2 Patient was seen for high-level balance tasks in standing with emphasis on navigating in and around small, narrow spaces. Required contact guard assist for balance and turning behaviors. Patient also participating in reciprocal stepping over objects 6 inches in height  with cues and contact guard assist. Continued focus turning with utilizing  half turns and quarter turns with cues.                 OT Education - 05/10/16 1140    Education provided Yes   Education Details HEP, balance tasks   Person(s) Educated Patient   Methods Explanation;Demonstration;Verbal cues   Comprehension Verbal cues required;Returned demonstration;Verbalized understanding             OT Long Term Goals - 05/09/16 1116    OT LONG TERM GOAL #1   Title Patient will improve gait speed and endurance and be able to walk 1500 feet in 6 minutes to negotiate around the home and community safely in  4 weeks   Baseline 6 minute walk test at evaluation 1225 feet, 1445 feet at 10th visit   Time 4   Period Weeks   Status Revised   OT LONG TERM GOAL #2   Title Patient will complete HEP for maximal daily exercises with modified independence in 4 weeks     Time 4   Period Weeks   Status Partially Met   OT LONG TERM GOAL #3   Title Patient will transfer from sit to stand without the use of arms safely and independently from a variety of chairs/surfaces in 4 weeks.   Time 4   Period Weeks   Status Partially Met   OT LONG TERM GOAL #4   Title Patient will decrease frequency of freezing episodes with score of 6 or less on Freezing of Gait questionnaire.      Baseline score of 7 on eval    Time 4   Period Weeks   Status Partially Met   OT LONG TERM GOAL #5   Title Patient will be able to obtain clothing from the dryer with modified independence.     Time 4   Period Weeks   Status Partially Met               Plan - 05/10/16 1141    Clinical Impression Statement Will continue to work with patient on higher level balance tasks to improve turning behaviors and balance while navigating in small spaces. Will also continue to reinforce big principles with transfers and maximal daily exercise. Patient continues to demonstrate difficulty with reciprocal arm swing with functional mobility when distracted.    Rehab Potential Good   OT Frequency 4x / week   OT Duration 4 weeks   OT Treatment/Interventions Self-care/ADL training;Therapeutic exercise;Functional Mobility Training;Patient/family education;Neuromuscular education;Balance training;Therapeutic exercises;DME and/or AE instruction;Therapeutic activities;Gait Proofreader and Agree with Plan of Care Patient      Patient will benefit from skilled therapeutic intervention in order to improve the following deficits and impairments:  Abnormal gait, Improper body mechanics, Decreased knowledge of use of DME,  Decreased strength, Impaired flexibility, Decreased balance, Decreased mobility, Difficulty walking, Decreased range of motion, Decreased coordination, Impaired UE functional use  Visit Diagnosis: Difficulty in walking, not elsewhere classified  Unsteadiness on feet  Muscle weakness (generalized)  Other lack of coordination    Problem List Patient Active Problem List   Diagnosis Date Noted  . Allergic rhinitis 05/03/2016  . Herpes simplex 05/03/2016  . 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 OP (osteoporosis) 05/03/2016  . Acute myocardial infarction ([place]) 10/02/2015  . History of cardiac catheterization 10/02/2015  . Incomplete uterine prolapse 12/27/2014  . Chronic constipation 12/26/2014  . H/O malignant neoplasm of colon 10/13/2014  . Parkinsonism ([place]) 05/04/2013  . Right arm pain 05/04/2013  .  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 Abnormal reflex 05/04/2013  . Parkinson's disease ([place]) 05/04/2013   [place]land Simkins T Tomasita Morrow, OTR/L, CLT  Felicia Snyder 05/10/2016, 2:17 PM  [place] MAIN [facility] SERVICES [address] [place], [place], [address] Phone: [phone]   Fax:  [phone]  Name: Felicia Snyder MRN: [id number] Date of Birth: October 20, 1946

## 2016-05-10 NOTE — Therapy (Signed)
[place] MAIN [facility] SERVICES [address] [place], [place], [address] Phone: [phone]   Fax:  [phone]  Occupational Therapy Treatment/Progress Note 04/16/2016 to 05/08/2016 Patient Details  Name: Felicia Snyder MRN: [id number] Date of Birth: [birth year] Referring Provider: Mervyn Skeeters  Encounter Date: 05/08/2016      OT End of Session - 05/09/16 1115    Visit Number 10   Number of Visits 17   Authorization Type Medicare G code 10   OT Start Time 1100   OT Stop Time 1158   OT Time Calculation (min) 58 min   Activity Tolerance Patient tolerated treatment well   Behavior During Therapy [facility] for tasks assessed/performed      Past Medical History  Diagnosis Date  . Premature osteoporosis   . Cancer [facility]) 1996    colon cancer    Past Surgical History  Procedure Laterality Date  . Ruptured disk  [id number]  . Broken ankle  2011  . Colectomy    . Tooth extraction      There were no vitals filed for this visit.      Subjective Assessment - 05/09/16 1109    Subjective  Patient reports she had a good trip visiting family over the holiday and was able to do her exercises.  Was also active watching her young grandchildren.   Patient Stated Goals Patient reports she wants to have better movement in the right arm, be independent.    Currently in Pain? No/denies   Pain Score 0-No pain                      OT Treatments/Exercises (OP) - 05/09/16 1111    ADLs   ADL Comments Functional component tasks as follows: 1) sit to stand, 2) reaching to the back of her head to perform hair care, 3) reaching down to shoes for tying, 4) fine motor coordination to thread a needle for sewing, 5) picking up and manipulation of small objects 1/2 inch in size or less, cues provided for BIG movements and technique. Crossed leg method for reaching to shoes for tying. Postural exercises with wall stretches with cues.   Neurological  Re-education Exercises   Other Exercises 1 Patient seen for LSVT BIG exercises: LSVT Daily Session Maximal Daily Exercises: Sustained movements are designed to rescale the amplitude of movement output for generalization to daily functional activities. Performed as follows for 1 set of 10 repetitions each: Multi directional sustained movements- 1) Floor to ceiling, 2) Side to side. Multi directional Repetitive movements performed in standing and are designed to provide retraining effort needed for sustained muscle activation in tasks Performed as follows: 3) Step and reach forward, 4) Step and Reach Backwards, 5) Step and reach sideways, 6) Rock and reach forward/backward, 7) Rock and reach sideways. Sit to stand from mat table on lowest setting with cues for weight shift, technique and SBA for 10 reps for 1 set. Patient performing reciprocal stepping exercise with SBA and cues for 10 reps each foot, stair negotiation 4 steps for 5 reps each, cues for big turns, SBA. All maximal daily exercises completed in standard version with therapist assist for balance, did not use chair this date. Hand flicks to exercises in sitting with cues for technique.   Other Exercises 2 Reassessment this date with 5 times sit to stand:  11 secs, 6  minute walk test 1445 feet.  Focused on turning behaviors this date with quarter turns and half  turns instead of the wide turns patient tends to demonstrate,                  OT Education - 05/09/16 1115    Education provided Yes   Education Details HEP, turning behaviors   Person(s) Educated Patient   Methods Explanation;Demonstration;Verbal cues   Comprehension Verbal cues required;Returned demonstration;Verbalized understanding             OT Long Term Goals - 05/09/16 1116    OT LONG TERM GOAL #1   Title Patient will improve gait speed and endurance and be able to walk 1500 feet in 6 minutes to negotiate around the home and community safely in 4 weeks    Baseline 6 minute walk test at evaluation 1225 feet, 1445 feet at 10th visit   Time 4   Period Weeks   Status Revised   OT LONG TERM GOAL #2   Title Patient will complete HEP for maximal daily exercises with modified independence in 4 weeks     Time 4   Period Weeks   Status Partially Met   OT LONG TERM GOAL #3   Title Patient will transfer from sit to stand without the use of arms safely and independently from a variety of chairs/surfaces in 4 weeks.   Time 4   Period Weeks   Status Partially Met   OT LONG TERM GOAL #4   Title Patient will decrease frequency of freezing episodes with score of 6 or less on Freezing of Gait questionnaire.      Baseline score of 7 on eval    Time 4   Period Weeks   Status Partially Met   OT LONG TERM GOAL #5   Title Patient will be able to obtain clothing from the dryer with modified independence.     Time 4   Period Weeks   Status Partially Met               Plan - 05/10/16 1116    Clinical Impression Statement Patient has made significant improvement in all areas with participation in LSVT big intensive program. Her six minute walk test improved from 1225 feet to 1445 feet at her 10th visit. She has progressed to completing her home exercise program from an adapted method to a standard method of completion at home with good balance. She continues to progress with sit to stand transfers from a variety of surfaces including stable and unstable surfaces. She continues to demonstrate wide turns with functional gait and requires cues for utilizing more of a quarter or half turn method as well as cues for increased amplitude of steps. She continues to benefit from  skilled OT intensive program for Parkinson's and will work towards increasing level of distractions during functional gait as well as calibration of skills utilizing LSVT big principles.    Rehab Potential Good   OT Frequency 4x / week   OT Duration 4 weeks   OT Treatment/Interventions  Self-care/ADL training;Therapeutic exercise;Functional Mobility Training;Patient/family education;Neuromuscular education;Balance training;Therapeutic exercises;DME and/or AE instruction;Therapeutic activities;Gait Proofreader and Agree with Plan of Care Patient      Patient will benefit from skilled therapeutic intervention in order to improve the following deficits and impairments:  Abnormal gait, Improper body mechanics, Decreased knowledge of use of DME, Decreased strength, Impaired flexibility, Decreased balance, Decreased mobility, Difficulty walking, Decreased range of motion, Decreased coordination, Impaired UE functional use  Visit Diagnosis: Difficulty in walking, not elsewhere classified  Unsteadiness on feet  Muscle weakness (generalized)  Other lack of coordination    Problem List Patient Active Problem List   Diagnosis Date Noted  . Allergic rhinitis 05/03/2016  . Herpes simplex 05/03/2016  . 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 OP (osteoporosis) 05/03/2016  . Acute myocardial infarction ([place]) 10/02/2015  . History of cardiac catheterization 10/02/2015  . Incomplete uterine prolapse 12/27/2014  . Chronic constipation 12/26/2014  . H/O malignant neoplasm of colon 10/13/2014  . Parkinsonism ([place]) 05/04/2013  . Right arm pain 05/04/2013  . 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 Abnormal reflex 05/04/2013  . Parkinson's disease ([place]) 05/04/2013   Geneieve Duell T Tomasita Morrow, OTR/L, CLT  Nazareth Kirk 05/10/2016, 11:26 AM  [place] MAIN [facility] SERVICES [address] [place], [place], [address] Phone: [phone]   Fax:  [phone]  Name: Rocquel Askren MRN: [id number] Date of Birth: 1946/03/05

## 2016-05-10 NOTE — Therapy (Signed)
[place] MAIN [facility] SERVICES [address] [place], [place], [address] Phone: [phone]   Fax:  [phone]  Occupational Therapy Treatment  Patient Details  Name: Felicia Snyder MRN: [id number] Date of Birth: Jul 21, 1946 Referring Provider: Mervyn Skeeters  Encounter Date: 05/10/2016      OT End of Session - 05/10/16 1446    Visit Number 12   Number of Visits 17   Authorization Type Medicare G code 12   OT Start Time 1000   OT Stop Time 1100   OT Time Calculation (min) 60 min   Activity Tolerance Patient tolerated treatment well   Behavior During Therapy [facility] for tasks assessed/performed      Past Medical History  Diagnosis Date  . Premature osteoporosis   . Cancer [facility]) 1996    colon cancer    Past Surgical History  Procedure Laterality Date  . Ruptured disk  [id number]  . Broken ankle  2011  . Colectomy    . Tooth extraction      There were no vitals filed for this visit.      Subjective Assessment - 05/10/16 1441    Subjective  Patient reports she felt her right knee pulling a bit when getting out of the car today but was fine during all exercises this date in the clinic.   Patient Stated Goals Patient reports she wants to have better movement in the right arm, be independent.    Currently in Pain? No/denies   Pain Score 0-No pain                      OT Treatments/Exercises (OP) - 05/10/16 1442    ADLs   ADL Comments Functional component tasks as follows: 1) sit to stand, 2) reaching to the back of her head to perform hair care, 3) reaching down to shoes for tying, 4) fine motor coordination to thread a needle for sewing, 5) picking up and manipulation of small objects 1/2 inch in size or less, cues provided for BIG movements and technique. Crossed leg method for reaching to shoes for tying. Postural exercises with wall stretches with cues.   Neurological Re-education Exercises   Other Exercises 1 Patient  seen for LSVT BIG exercises: LSVT Daily Session Maximal Daily Exercises: Sustained movements are designed to rescale the amplitude of movement output for generalization to daily functional activities. Performed as follows for 1 set of 10 repetitions each: Multi directional sustained movements- 1) Floor to ceiling, 2) Side to side. Multi directional Repetitive movements performed in standing and are designed to provide retraining effort needed for sustained muscle activation in tasks Performed as follows: 3) Step and reach forward, 4) Step and Reach Backwards, 5) Step and reach sideways, 6) Rock and reach forward/backward, 7) Rock and reach sideways. Sit to stand from mat table on lowest setting with cues for weight shift, technique and SBA for 10 reps for 1 set. Patient performing reciprocal stepping exercise with SBA and cues for 10 reps each foot, stair negotiation 4 steps for 5 reps each, cues for big turns, SBA. All maximal daily exercises completed in standard version with therapist assist for balance. Hand flicks to exercises in sitting with cues for technique.   Other Exercises 2 Patient was seen for high-level balance tasks in standing with emphasis on navigating in and around small, narrow spaces. Continued to require contact guard assist for balance and turning behaviors. Patient also participating in reciprocal stepping over objects 6  inches in height with cues and contact guard assist. Continued focus turning with utilizing half turns and quarter turns with cues.  Tandem stepping with CGA for multiple reps and trials.  Functional mobility for 1000 feet with SBA and cues for reciprocal arm swing.                OT Education - 05/10/16 1445    Education provided Yes   Education Details maximal daily exercises, balance   Person(s) Educated Patient   Methods Explanation;Demonstration;Verbal cues   Comprehension Verbal cues required;Returned demonstration;Verbalized understanding              OT Long Term Goals - 05/09/16 1116    OT LONG TERM GOAL #1   Title Patient will improve gait speed and endurance and be able to walk 1500 feet in 6 minutes to negotiate around the home and community safely in 4 weeks   Baseline 6 minute walk test at evaluation 1225 feet, 1445 feet at 10th visit   Time 4   Period Weeks   Status Revised   OT LONG TERM GOAL #2   Title Patient will complete HEP for maximal daily exercises with modified independence in 4 weeks     Time 4   Period Weeks   Status Partially Met   OT LONG TERM GOAL #3   Title Patient will transfer from sit to stand without the use of arms safely and independently from a variety of chairs/surfaces in 4 weeks.   Time 4   Period Weeks   Status Partially Met   OT LONG TERM GOAL #4   Title Patient will decrease frequency of freezing episodes with score of 6 or less on Freezing of Gait questionnaire.      Baseline score of 7 on eval    Time 4   Period Weeks   Status Partially Met   OT LONG TERM GOAL #5   Title Patient will be able to obtain clothing from the dryer with modified independence.     Time 4   Period Weeks   Status Partially Met               Plan - 05/10/16 1446    Clinical Impression Statement Patient demos decreased balance with navigating around objects and in small spaces.  She requires CGA for exercises in tandem stepping.  Continue to work towards goals and plan to complete intensive phase of program and discharge by the end of next week,    Rehab Potential Good   OT Frequency 4x / week   OT Duration 4 weeks   OT Treatment/Interventions Self-care/ADL training;Therapeutic exercise;Functional Mobility Training;Patient/family education;Neuromuscular education;Balance training;Therapeutic exercises;DME and/or AE instruction;Therapeutic activities;Gait Proofreader and Agree with Plan of Care Patient      Patient will benefit from skilled therapeutic intervention  in order to improve the following deficits and impairments:  Abnormal gait, Improper body mechanics, Decreased knowledge of use of DME, Decreased strength, Impaired flexibility, Decreased balance, Decreased mobility, Difficulty walking, Decreased range of motion, Decreased coordination, Impaired UE functional use  Visit Diagnosis: Difficulty in walking, not elsewhere classified  Unsteadiness on feet  Muscle weakness (generalized)  Other lack of coordination    Problem List Patient Active Problem List   Diagnosis Date Noted  . Allergic rhinitis 05/03/2016  . Herpes simplex 05/03/2016  . 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 OP (osteoporosis) 05/03/2016  . Acute myocardial infarction ([place]) 10/02/2015  . History of cardiac  catheterization 10/02/2015  . Incomplete uterine prolapse 12/27/2014  . Chronic constipation 12/26/2014  . H/O malignant neoplasm of colon 10/13/2014  . Parkinsonism ([place]) 05/04/2013  . Right arm pain 05/04/2013  . 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 Abnormal reflex 05/04/2013  . Parkinson's disease ([place]) 05/04/2013   Amy T Tomasita Morrow, OTR/L, CLT  Lovett,Amy 05/10/2016, 2:49 PM  [place] MAIN [facility] SERVICES [address] [place], [place], [address] Phone: [phone]   Fax:  [phone]  Name: Felicia Snyder MRN: [id number] Date of Birth: Mar 04, 1946

## 2016-05-13 ENCOUNTER — Ambulatory Visit: Payer: Medicare HMO | Admitting: Occupational Therapy

## 2016-05-13 DIAGNOSIS — R278 Other lack of coordination: Secondary | ICD-10-CM

## 2016-05-13 DIAGNOSIS — M6281 Muscle weakness (generalized): Secondary | ICD-10-CM

## 2016-05-13 DIAGNOSIS — R262 Difficulty in walking, not elsewhere classified: Secondary | ICD-10-CM | POA: Diagnosis not present

## 2016-05-13 DIAGNOSIS — R2681 Unsteadiness on feet: Secondary | ICD-10-CM

## 2016-05-14 ENCOUNTER — Ambulatory Visit: Payer: Medicare HMO | Admitting: Occupational Therapy

## 2016-05-14 DIAGNOSIS — R278 Other lack of coordination: Secondary | ICD-10-CM

## 2016-05-14 DIAGNOSIS — R262 Difficulty in walking, not elsewhere classified: Secondary | ICD-10-CM | POA: Diagnosis not present

## 2016-05-14 DIAGNOSIS — M6281 Muscle weakness (generalized): Secondary | ICD-10-CM

## 2016-05-14 DIAGNOSIS — R2681 Unsteadiness on feet: Secondary | ICD-10-CM

## 2016-05-15 ENCOUNTER — Ambulatory Visit: Payer: Medicare HMO | Admitting: Occupational Therapy

## 2016-05-15 DIAGNOSIS — R262 Difficulty in walking, not elsewhere classified: Secondary | ICD-10-CM

## 2016-05-15 DIAGNOSIS — R2681 Unsteadiness on feet: Secondary | ICD-10-CM

## 2016-05-15 DIAGNOSIS — M6281 Muscle weakness (generalized): Secondary | ICD-10-CM

## 2016-05-15 DIAGNOSIS — R278 Other lack of coordination: Secondary | ICD-10-CM

## 2016-05-15 NOTE — Therapy (Signed)
[place] MAIN [facility] SERVICES [address] [place], [place], [address] Phone: [phone]   Fax:  [phone]  Occupational Therapy Treatment  Patient Details  Name: Felicia Snyder MRN: [id number] Date of Birth: 03/29/1946 Referring Provider: Mervyn Skeeters  Encounter Date: 05/14/2016      OT End of Session - 05/14/16 2253    Visit Number 14   Number of Visits 17   Authorization Type Medicare G code 14   OT Start Time 1015   OT Stop Time 1114   OT Time Calculation (min) 59 min   Activity Tolerance Patient tolerated treatment well   Behavior During Therapy [facility] for tasks assessed/performed      Past Medical History  Diagnosis Date  . Premature osteoporosis   . Cancer [facility]) 1996    colon cancer    Past Surgical History  Procedure Laterality Date  . Ruptured disk  [id number]  . Broken ankle  2011  . Colectomy    . Tooth extraction      There were no vitals filed for this visit.      Subjective Assessment - 05/14/16 2251    Subjective  Patient reports she feels ready for discharge this week and feels more comfortable with performing exercises at home in the last few days and reports she no longer has to use the chair for balance.    Patient Stated Goals Patient reports she wants to have better movement in the right arm, be independent.    Currently in Pain? No/denies   Pain Score 0-No pain                      OT Treatments/Exercises (OP) - 05/14/16 2251    ADLs   ADL Comments Functional component tasks as follows: 1) sit to stand, 2) reaching to the back of her head to perform hair care, 3) reaching down to shoes for tying, 4) fine motor coordination to thread a needle for sewing, 5) picking up and manipulation of small objects 1/2 inch in size or less, cues provided for BIG movements and technique. Crossed leg method for reaching to shoes for tying. Postural exercises with wall stretches    Neurological  Re-education Exercises   Other Exercises 1 the amplitude of movement output for generalization to daily functional activities. Performed as follows for 1 set of 10 repetitions each: Multi directional sustained movements- 1) Floor to ceiling, 2) Side to side. Multi directional Repetitive movements performed in standing and are designed to provide retraining effort needed for sustained muscle activation in tasks Performed as follows: 3) Step and reach forward, 4) Step and Reach Backwards, 5) Step and reach sideways, 6) Rock and reach forward/backward, 7) Rock and reach sideways. Sit to stand from mat table on lowest setting with cues for weight shift, technique for 10 reps for 1 set. Patient performing reciprocal stepping exercise and cues for 10 reps each foot, stair negotiation 4 steps for 5 reps each, cues for big turns. All maximal daily exercises completed in standard version. 1# weight in each hand during exercises this date.   Other Exercises 2 Patient seen for balance exercises in standing with BOSU ball, flat side in the upward position and focus on progression of balance to holding object in front of her rather than holding onto parallel bars, contact guard to min assist for balance with this task. Tandem exercises with heel to toe walking for 10 feet for six trials with contact guard  assistance for balance.                 OT Education - 05/14/16 2252    Education provided Yes   Education Details LSVT BIG exercises, progressions, balance   Person(s) Educated Patient   Methods Explanation;Demonstration   Comprehension Verbal cues required;Returned demonstration;Verbalized understanding             OT Long Term Goals - 05/09/16 1116    OT LONG TERM GOAL #1   Title Patient will improve gait speed and endurance and be able to walk 1500 feet in 6 minutes to negotiate around the home and community safely in 4 weeks   Baseline 6 minute walk test at evaluation 1225 feet, 1445 feet at  10th visit   Time 4   Period Weeks   Status Revised   OT LONG TERM GOAL #2   Title Patient will complete HEP for maximal daily exercises with modified independence in 4 weeks     Time 4   Period Weeks   Status Partially Met   OT LONG TERM GOAL #3   Title Patient will transfer from sit to stand without the use of arms safely and independently from a variety of chairs/surfaces in 4 weeks.   Time 4   Period Weeks   Status Partially Met   OT LONG TERM GOAL #4   Title Patient will decrease frequency of freezing episodes with score of 6 or less on Freezing of Gait questionnaire.      Baseline score of 7 on eval    Time 4   Period Weeks   Status Partially Met   OT LONG TERM GOAL #5   Title Patient will be able to obtain clothing from the dryer with modified independence.     Time 4   Period Weeks   Status Partially Met               Plan - 05/14/16 2254    Clinical Impression Statement Patient has continued to demonstrate excellent progress with participation in intensive therapy program for LSVT big. She has improved with amplitude of gait and requires occasional cues for reciprocal arm swing . Will plan to perform final outcome measures next date and prepare patient for discharge.    Rehab Potential Good   OT Frequency 4x / week   OT Duration 4 weeks   OT Treatment/Interventions Self-care/ADL training;Therapeutic exercise;Functional Mobility Training;Patient/family education;Neuromuscular education;Balance training;Therapeutic exercises;DME and/or AE instruction;Therapeutic activities;Gait Proofreader and Agree with Plan of Care Patient      Patient will benefit from skilled therapeutic intervention in order to improve the following deficits and impairments:  Abnormal gait, Improper body mechanics, Decreased knowledge of use of DME, Decreased strength, Impaired flexibility, Decreased balance, Decreased mobility, Difficulty walking, Decreased range of  motion, Decreased coordination, Impaired UE functional use  Visit Diagnosis: Difficulty in walking, not elsewhere classified  Unsteadiness on feet  Muscle weakness (generalized)  Other lack of coordination    Problem List Patient Active Problem List   Diagnosis Date Noted  . Allergic rhinitis 05/03/2016  . Herpes simplex 05/03/2016  . 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 OP (osteoporosis) 05/03/2016  . Acute myocardial infarction ([place]) 10/02/2015  . History of cardiac catheterization 10/02/2015  . Incomplete uterine prolapse 12/27/2014  . Chronic constipation 12/26/2014  . H/O malignant neoplasm of colon 10/13/2014  . Parkinsonism ([place]) 05/04/2013  . Right arm pain 05/04/2013  . 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  Abnormal reflex 05/04/2013  . Parkinson's disease ([place]) 05/04/2013   Meighan Treto T Tomasita Morrow, OTR/L, CLT  Raeqwon Lux 05/15/2016, 10:55 PM  [place] MAIN [facility] SERVICES [address] [place], [place], [address] Phone: [phone]   Fax:  [phone]  Name: Willis Kuipers MRN: [id number] Date of Birth: 06-01-1946

## 2016-05-15 NOTE — Therapy (Signed)
[place] MAIN [facility] SERVICES [address] [place], [place], [address] Phone: [phone]   Fax:  [phone]  Occupational Therapy Treatment  Patient Details  Name: Felicia Snyder MRN: [id number] Date of Birth: [birth year] Referring Provider: Mervyn Skeeters  Encounter Date: 05/13/2016      OT End of Session - 05/14/16 2053    Visit Number 13   Number of Visits 17   Authorization Type Medicare G code 13   OT Start Time 1015   OT Stop Time 1110   OT Time Calculation (min) 55 min   Activity Tolerance Patient tolerated treatment well   Behavior During Therapy [facility] for tasks assessed/performed      Past Medical History  Diagnosis Date  . Premature osteoporosis   . Cancer [facility]) 1996    colon cancer    Past Surgical History  Procedure Laterality Date  . Ruptured disk  [id number]  . Broken ankle  2011  . Colectomy    . Tooth extraction      There were no vitals filed for this visit.      Subjective Assessment - 05/14/16 2051    Subjective  Patient reports she has been doing well, feels she is able to do her exercises at home with the use of handouts,  No lomger using a chair for adapted technique.   Patient Stated Goals Patient reports she wants to have better movement in the right arm, be independent.    Currently in Pain? No/denies   Pain Score 0-No pain                      OT Treatments/Exercises (OP) - 05/14/16 2132    ADLs   ADL Comments Functional component tasks as follows: 1) sit to stand, 2) reaching to the back of her head to perform hair care, 3) reaching down to shoes for tying, 4) fine motor coordination to thread a needle for sewing, 5) picking up and manipulation of small objects 1/2 inch in size or less, cues provided for BIG movements and technique. Crossed leg method for reaching to shoes for tying. Postural exercises with wall stretches    Neurological Re-education Exercises   Other Exercises 1  Patient seen for LSVT BIG exercises: LSVT Daily Session Maximal Daily Exercises: Sustained movements are designed to rescale the amplitude of movement output for generalization to daily functional activities. Performed as follows for 1 set of 10 repetitions each: Multi directional sustained movements- 1) Floor to ceiling, 2) Side to side. Multi directional Repetitive movements performed in standing and are designed to provide retraining effort needed for sustained muscle activation in tasks Performed as follows: 3) Step and reach forward, 4) Step and Reach Backwards, 5) Step and reach sideways, 6) Rock and reach forward/backward, 7) Rock and reach sideways. Sit to stand from mat table on lowest setting with cues for weight shift, technique for 10 reps for 1 set. Patient performing reciprocal stepping exercise and cues for 10 reps each foot, stair negotiation 4 steps for 5 reps each, cues for big turns. All maximal daily exercises completed in standard version.  1# weight in each hand during exercises this date.   Other Exercises 2 Patient was seen for high-level balance tasks in standing with use of BOSU ball, bottom side turned up, CGA to min assist for balance, cues to shift weight.  Cues for technique to step onto and off of ball.  Functional mobility for 1000 feet independently and  occasional cues for reciprocal arm swing.                OT Education - 05/14/16 2053    Education provided Yes   Education Details HEP, balance   Person(s) Educated Patient   Methods Explanation;Demonstration;Verbal cues   Comprehension Verbal cues required;Returned demonstration;Verbalized understanding             OT Long Term Goals - 05/09/16 1116    OT LONG TERM GOAL #1   Title Patient will improve gait speed and endurance and be able to walk 1500 feet in 6 minutes to negotiate around the home and community safely in 4 weeks   Baseline 6 minute walk test at evaluation 1225 feet, 1445 feet at 10th  visit   Time 4   Period Weeks   Status Revised   OT LONG TERM GOAL #2   Title Patient will complete HEP for maximal daily exercises with modified independence in 4 weeks     Time 4   Period Weeks   Status Partially Met   OT LONG TERM GOAL #3   Title Patient will transfer from sit to stand without the use of arms safely and independently from a variety of chairs/surfaces in 4 weeks.   Time 4   Period Weeks   Status Partially Met   OT LONG TERM GOAL #4   Title Patient will decrease frequency of freezing episodes with score of 6 or less on Freezing of Gait questionnaire.      Baseline score of 7 on eval    Time 4   Period Weeks   Status Partially Met   OT LONG TERM GOAL #5   Title Patient will be able to obtain clothing from the dryer with modified independence.     Time 4   Period Weeks   Status Partially Met               Plan - 05/14/16 2054    Clinical Impression Statement Patient continues to work towards improved ability to complete balance tasks, maximal daily exercises and coordination tasks.  She is able to progress with adding weights to exercises this date.  Continue to provide challenges to enhance carryover of exercises and calibration of movements for daily activities.     Rehab Potential Good   OT Frequency 4x / week   OT Duration 4 weeks   OT Treatment/Interventions Self-care/ADL training;Therapeutic exercise;Functional Mobility Training;Patient/family education;Neuromuscular education;Balance training;Therapeutic exercises;DME and/or AE instruction;Therapeutic activities;Gait Proofreader and Agree with Plan of Care Patient      Patient will benefit from skilled therapeutic intervention in order to improve the following deficits and impairments:  Abnormal gait, Improper body mechanics, Decreased knowledge of use of DME, Decreased strength, Impaired flexibility, Decreased balance, Decreased mobility, Difficulty walking, Decreased range  of motion, Decreased coordination, Impaired UE functional use  Visit Diagnosis: Difficulty in walking, not elsewhere classified  Unsteadiness on feet  Muscle weakness (generalized)  Other lack of coordination    Problem List Patient Active Problem List   Diagnosis Date Noted  . Allergic rhinitis 05/03/2016  . Herpes simplex 05/03/2016  . 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 OP (osteoporosis) 05/03/2016  . Acute myocardial infarction ([place]) 10/02/2015  . History of cardiac catheterization 10/02/2015  . Incomplete uterine prolapse 12/27/2014  . Chronic constipation 12/26/2014  . H/O malignant neoplasm of colon 10/13/2014  . Parkinsonism ([place]) 05/04/2013  . Right arm pain 05/04/2013  . 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  Abnormal reflex 05/04/2013  . Parkinson's disease ([place]) 05/04/2013   Erich Kochan T Tomasita Morrow, OTR/L, CLT  Felicia Snyder 05/15/2016, 9:40 PM  [place] MAIN [facility] SERVICES [address] [place], [place], [address] Phone: [phone]   Fax:  [phone]  Name: Felicia Snyder MRN: [id number] Date of Birth: [birth year]

## 2016-05-16 ENCOUNTER — Encounter: Payer: Self-pay | Admitting: Occupational Therapy

## 2016-05-16 ENCOUNTER — Ambulatory Visit: Payer: Medicare HMO | Admitting: Occupational Therapy

## 2016-05-16 NOTE — Therapy (Signed)
[place] MAIN [facility] SERVICES [address] [place], [place], [address] Phone: [phone]   Fax:  [phone]  Occupational Therapy Treatment/Discharge Summary  Patient Details  Name: Felicia Snyder MRN: [id number] Date of Birth: 02-20-1946 Referring Provider: Mervyn Skeeters  Encounter Date: 05/15/2016      OT End of Session - 05/16/16 1010    Visit Number 15   Number of Visits 17   Authorization Type Medicare G code 15   OT Start Time 1015   OT Stop Time 1115   OT Time Calculation (min) 60 min   Activity Tolerance Patient tolerated treatment well   Behavior During Therapy [facility] for tasks assessed/performed      Past Medical History  Diagnosis Date  . Premature osteoporosis   . Cancer [facility]) 1996    colon cancer    Past Surgical History  Procedure Laterality Date  . Ruptured disk  [id number]  . Broken ankle  2011  . Colectomy    . Tooth extraction      There were no vitals filed for this visit.      Subjective Assessment - 05/16/16 1007    Subjective  Patient reports she is doing well and feels ready for discharge. She denies any pain. She is pleased with her progress.    Patient Stated Goals Patient reports she wants to have better movement in the right arm, be independent.    Currently in Pain? No/denies   Pain Score 0-No pain                      OT Treatments/Exercises (OP) - 05/16/16 1008    ADLs   ADL Comments Functional component tasks as follows: 1) sit to stand, 2) reaching to the back of her head to perform hair care, 3) reaching down to shoes for tying, 4) fine motor coordination to thread a needle for sewing, 5) picking up and manipulation of small objects 1/2 inch in size or less, cues provided for BIG movements and technique. Crossed leg method for reaching to shoes for tying. Postural exercises with wall stretches    Neurological Re-education Exercises   Other Exercises 1 Multi directional  sustained movements- 1) Floor to ceiling, 2) Side to side. Multi directional Repetitive movements performed in standing and are designed to provide retraining effort needed for sustained muscle activation in tasks Performed as follows: 3) Step and reach forward, 4) Step and Reach Backwards, 5) Step and reach sideways, 6) Rock and reach forward/backward, 7) Rock and reach sideways. Sit to stand from mat table on lowest setting with cues for weight shift, technique for 10 reps for 1 set. Patient performing reciprocal stepping exercise and cues for 10 reps each foot, stair negotiation 4 steps for 5 reps each, cues for big turns. All maximal daily exercises completed in standard version. 1# weight in each hand during exercises this date.   Other Exercises 2 Patient seen for reassessment this date of outcome measures as follows:Five times sit to stand 9 seconds, six minute walk test 1565 feet, freezing of gate questionnaire score of 5. Berg balance test 54/56.Patient instructed on hierarchy of progressing in complexity with exercises at home. Issued written handout with information                OT Education - 05/16/16 1009    Education provided Yes   Education Details HEP, outcomes, goals   Person(s) Educated Patient   Methods Explanation;Demonstration   Comprehension  Returned demonstration;Verbalized understanding             OT Long Term Goals - 05/16/16 1011    OT LONG TERM GOAL #1   Title Patient will improve gait speed and endurance and be able to walk 1500 feet in 6 minutes to negotiate around the home and community safely in 4 weeks   Baseline 6 minute walk test at evaluation 1225 feet, 1445 feet at 10th visit, 1565 feet at discharge   Time 4   Period Weeks   Status Achieved   OT LONG TERM GOAL #2   Title Patient will complete HEP for maximal daily exercises with modified independence in 4 weeks     Time 4   Period Weeks   Status Achieved   OT LONG TERM GOAL #3   Title  Patient will transfer from sit to stand without the use of arms safely and independently from a variety of chairs/surfaces in 4 weeks.   Time 4   Period Weeks   Status Achieved   OT LONG TERM GOAL #4   Title Patient will decrease frequency of freezing episodes with score of 6 or less on Freezing of Gait questionnaire.      Baseline score of 7 on eval , 5 at dc   Time 4   Period Weeks   Status Achieved   OT LONG TERM GOAL #5   Title Patient will be able to obtain clothing from the dryer with modified independence.     Time 4   Period Weeks   Status Achieved               Plan - 05/16/16 1011    Clinical Impression Statement Patient has made significant progress in all areas since evaluation. Her six minute walk test went from 1225 feet to 1565 feet in four weeks. Her balance improved and she is demonstrating decreased hesitation with initiation of gate and turning behaviors. She has been able to incorporate functional components tasks into bigger tasks for greater independence in these areas. She has met her goals and is independent with home exercise program. She understands she will need to continue to participate in her HEP daily, minimum of once a day. Will discharge patient at this time.    Rehab Potential Good   OT Frequency 4x / week   OT Duration 4 weeks   OT Treatment/Interventions Self-care/ADL training;Therapeutic exercise;Functional Mobility Training;Patient/family education;Neuromuscular education;Balance training;Therapeutic exercises;DME and/or AE instruction;Therapeutic activities;Gait Training;Stair Training   Consulted and Agree with Plan of Care Patient      Patient will benefit from skilled therapeutic intervention in order to improve the following deficits and impairments:  Abnormal gait, Improper body mechanics, Decreased knowledge of use of DME, Decreased strength, Impaired flexibility, Decreased balance, Decreased mobility, Difficulty walking, Decreased range  of motion, Decreased coordination, Impaired UE functional use  Visit Diagnosis: Difficulty in walking, not elsewhere classified  Unsteadiness on feet  Muscle weakness (generalized)  Other lack of coordination      G-Codes - 06-09-16 1013    Functional Assessment Tool Used clinical judgment, 6 minute walk test, 5 times sit to stand, BERG balance test, freezing of gait questionairre, 9 hole peg test, strength and ROM testing.   Functional Limitation Mobility: Walking and moving around   Mobility: Walking and Moving Around Goal Status [phone]) At least 20 percent but less than 40 percent impaired, limited or restricted   Mobility: Walking and Moving Around Discharge Status [phone]) At least 20 percent  but less than 40 percent impaired, limited or restricted      Problem List Patient Active Problem List   Diagnosis Date Noted  . Allergic rhinitis 05/03/2016  . Herpes simplex 05/03/2016  . 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 OP (osteoporosis) 05/03/2016  . Acute myocardial infarction ([place]) 10/02/2015  . History of cardiac catheterization 10/02/2015  . Incomplete uterine prolapse 12/27/2014  . Chronic constipation 12/26/2014  . H/O malignant neoplasm of colon 10/13/2014  . Parkinsonism ([place]) 05/04/2013  . Right arm pain 05/04/2013  . 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 Abnormal reflex 05/04/2013  . Parkinson's disease ([place]) 05/04/2013   Amy T Tomasita Morrow, OTR/L, CLT  Lovett,Amy 05/16/2016, 10:14 AM  [place] MAIN [facility] SERVICES [address] [place], [place], [address] Phone: [phone]   Fax:  [phone]  Name: Zenola Dezarn MRN: [id number] Date of Birth: 03/24/1946

## 2016-05-17 ENCOUNTER — Ambulatory Visit: Payer: Medicare HMO | Admitting: Occupational Therapy

## 2016-06-18 DIAGNOSIS — C4361 Malignant melanoma of right upper limb, including shoulder: Secondary | ICD-10-CM | POA: Diagnosis not present

## 2016-06-20 DIAGNOSIS — Z0000 Encounter for general adult medical examination without abnormal findings: Secondary | ICD-10-CM | POA: Diagnosis not present

## 2016-06-20 DIAGNOSIS — E7800 Pure hypercholesterolemia, unspecified: Secondary | ICD-10-CM | POA: Diagnosis not present

## 2016-06-20 DIAGNOSIS — G20 Parkinson's disease: Secondary | ICD-10-CM | POA: Diagnosis not present

## 2016-06-20 DIAGNOSIS — I252 Old myocardial infarction: Secondary | ICD-10-CM | POA: Diagnosis not present

## 2016-06-20 DIAGNOSIS — I2510 Atherosclerotic heart disease of native coronary artery without angina pectoris: Secondary | ICD-10-CM | POA: Diagnosis not present

## 2016-07-02 DIAGNOSIS — Z9889 Other specified postprocedural states: Secondary | ICD-10-CM | POA: Diagnosis not present

## 2016-07-02 DIAGNOSIS — I214 Non-ST elevation (NSTEMI) myocardial infarction: Secondary | ICD-10-CM | POA: Diagnosis not present

## 2016-07-02 DIAGNOSIS — G20 Parkinson's disease: Secondary | ICD-10-CM | POA: Diagnosis not present

## 2016-08-22 DIAGNOSIS — G20 Parkinson's disease: Secondary | ICD-10-CM | POA: Diagnosis not present

## 2016-09-17 DIAGNOSIS — R69 Illness, unspecified: Secondary | ICD-10-CM | POA: Diagnosis not present

## 2016-10-23 ENCOUNTER — Telehealth: Payer: Self-pay | Admitting: Family Medicine

## 2016-10-23 NOTE — Telephone Encounter (Signed)
Pt had called for refill request on ACYCLOVIR 200 mg. Pt decided to schedule a F/u appt for tomorrow instead of requesting the refill over the phone she stated she would discuss this with Mikki Santee tomorrow at the [facility]. Thanks TNP

## 2016-10-24 ENCOUNTER — Ambulatory Visit (INDEPENDENT_AMBULATORY_CARE_PROVIDER_SITE_OTHER): Payer: Medicare HMO | Admitting: Family Medicine

## 2016-10-24 ENCOUNTER — Encounter: Payer: Self-pay | Admitting: Family Medicine

## 2016-10-24 VITALS — BP 104/64 | HR 60 | Temp 97.9°F | Resp 16 | Wt 164.2 lb

## 2016-10-24 DIAGNOSIS — B009 Herpesviral infection, unspecified: Secondary | ICD-10-CM | POA: Diagnosis not present

## 2016-10-24 MED ORDER — ACYCLOVIR 200 MG PO CAPS: 200.0000 mg | ORAL_CAPSULE | Freq: Every day | ORAL | 5 refills | Status: AC

## 2016-10-24 NOTE — Progress Notes (Signed)
Subjective:     Patient ID: Felicia Snyder, female   DOB: [birth year], 70 y.o.   MRN: [id number]  HPI  Chief Complaint  Patient presents with  . Herpes Zoster    Patient comes in office today requesting a refill on her Acyclovir 200mg , patient reports that she has a genital outbreak and has been gradually getting worse over the past several days.   States she may get two outbreaks a year. Currently followed by cardiology, Dr. Saralyn Pilar; [facility] neurology, Dr. Mervyn Skeeters;  [facility] dermatology for melanoma, and [facility] urology for a cystocele. States she wishes to schedule a physical in the future.   Review of Systems     Objective:   Physical Exam  Constitutional: She appears well-developed and well-nourished. No distress.       Assessment:    1. Herpes simplex: she will check and see if Valtrex covered by her insurance - acyclovir (ZOVIRAX) 200 MG capsule; Take 1 capsule (200 mg total) by mouth 5 (five) times daily. For five days per flare.  Dispense: 25 capsule; Refill: 5    Plan:    Phone if she wishes to change to Valtrex.

## 2016-10-24 NOTE — Patient Instructions (Signed)
Let me know if you want to switch to Valtrex in the future.

## 2016-11-08 DIAGNOSIS — R079 Chest pain, unspecified: Secondary | ICD-10-CM | POA: Diagnosis not present

## 2016-11-08 DIAGNOSIS — Z9889 Other specified postprocedural states: Secondary | ICD-10-CM | POA: Diagnosis not present

## 2016-11-08 DIAGNOSIS — G20 Parkinson's disease: Secondary | ICD-10-CM | POA: Diagnosis not present

## 2016-11-08 DIAGNOSIS — I214 Non-ST elevation (NSTEMI) myocardial infarction: Secondary | ICD-10-CM | POA: Diagnosis not present

## 2016-11-13 DIAGNOSIS — R079 Chest pain, unspecified: Secondary | ICD-10-CM | POA: Diagnosis not present

## 2016-11-13 DIAGNOSIS — I214 Non-ST elevation (NSTEMI) myocardial infarction: Secondary | ICD-10-CM | POA: Diagnosis not present

## 2016-11-13 DIAGNOSIS — Z9889 Other specified postprocedural states: Secondary | ICD-10-CM | POA: Diagnosis not present

## 2016-11-25 DIAGNOSIS — R079 Chest pain, unspecified: Secondary | ICD-10-CM | POA: Diagnosis not present

## 2016-11-25 DIAGNOSIS — I214 Non-ST elevation (NSTEMI) myocardial infarction: Secondary | ICD-10-CM | POA: Diagnosis not present

## 2016-11-25 DIAGNOSIS — Z9889 Other specified postprocedural states: Secondary | ICD-10-CM | POA: Diagnosis not present

## 2016-11-28 DIAGNOSIS — I2510 Atherosclerotic heart disease of native coronary artery without angina pectoris: Secondary | ICD-10-CM | POA: Diagnosis not present

## 2016-11-28 DIAGNOSIS — Z7982 Long term (current) use of aspirin: Secondary | ICD-10-CM | POA: Diagnosis not present

## 2016-11-28 DIAGNOSIS — Z0000 Encounter for general adult medical examination without abnormal findings: Secondary | ICD-10-CM | POA: Diagnosis not present

## 2016-11-28 DIAGNOSIS — E7800 Pure hypercholesterolemia, unspecified: Secondary | ICD-10-CM | POA: Diagnosis not present

## 2016-11-28 DIAGNOSIS — Z6828 Body mass index (BMI) 28.0-28.9, adult: Secondary | ICD-10-CM | POA: Diagnosis not present

## 2016-11-28 DIAGNOSIS — H259 Unspecified age-related cataract: Secondary | ICD-10-CM | POA: Diagnosis not present

## 2016-11-28 DIAGNOSIS — J309 Allergic rhinitis, unspecified: Secondary | ICD-10-CM | POA: Diagnosis not present

## 2016-11-28 DIAGNOSIS — I252 Old myocardial infarction: Secondary | ICD-10-CM | POA: Diagnosis not present

## 2016-11-28 DIAGNOSIS — K5900 Constipation, unspecified: Secondary | ICD-10-CM | POA: Diagnosis not present

## 2016-11-28 DIAGNOSIS — G20 Parkinson's disease: Secondary | ICD-10-CM | POA: Diagnosis not present

## 2016-12-03 DIAGNOSIS — H25013 Cortical age-related cataract, bilateral: Secondary | ICD-10-CM | POA: Diagnosis not present

## 2016-12-03 DIAGNOSIS — H2513 Age-related nuclear cataract, bilateral: Secondary | ICD-10-CM | POA: Diagnosis not present

## 2016-12-03 DIAGNOSIS — H04123 Dry eye syndrome of bilateral lacrimal glands: Secondary | ICD-10-CM | POA: Diagnosis not present

## 2016-12-03 DIAGNOSIS — H524 Presbyopia: Secondary | ICD-10-CM | POA: Diagnosis not present

## 2016-12-12 ENCOUNTER — Encounter: Payer: Self-pay | Admitting: Family Medicine

## 2016-12-24 DIAGNOSIS — C4361 Malignant melanoma of right upper limb, including shoulder: Secondary | ICD-10-CM | POA: Diagnosis not present

## 2017-04-26 ENCOUNTER — Encounter: Payer: Self-pay | Admitting: Family Medicine

## 2017-04-26 ENCOUNTER — Ambulatory Visit (INDEPENDENT_AMBULATORY_CARE_PROVIDER_SITE_OTHER): Payer: Medicare HMO | Admitting: Family Medicine

## 2017-04-26 VITALS — BP 100/60 | HR 71 | Temp 98.1°F | Wt 165.0 lb

## 2017-04-26 DIAGNOSIS — S80211A Abrasion, right knee, initial encounter: Secondary | ICD-10-CM | POA: Diagnosis not present

## 2017-04-26 DIAGNOSIS — L089 Local infection of the skin and subcutaneous tissue, unspecified: Secondary | ICD-10-CM

## 2017-04-26 MED ORDER — SODIUM CHLORIDE 0.9 % EX SOLN: 1.0000 "application " | Freq: Every day | CUTANEOUS | 0 refills | Status: DC

## 2017-04-26 MED ORDER — CEPHALEXIN 500 MG PO CAPS: 500.0000 mg | ORAL_CAPSULE | Freq: Two times a day (BID) | ORAL | 0 refills | Status: DC

## 2017-04-26 NOTE — Patient Instructions (Signed)
Daily saline cleansings and apply bandaid. Let me know if not improving.

## 2017-04-26 NOTE — Progress Notes (Signed)
Subjective:     Patient ID: Felicia Snyder, female   DOB: 11-Feb-1946, 71 y.o.   MRN: [id number]  HPI  Chief Complaint  Patient presents with  . Wound Check    Pt reports bumping right knee while cleaning the floor X 2 weeks ago. The area is red with a black scab. Patient has not tried any treatment.   No drainage reported. States she spend several days cleaning tile grout on her knees.   Review of Systems     Objective:   Physical Exam  Constitutional: She appears well-developed and well-nourished. No distress.  Skin:  Right knee with abrasion and overlying eschar. Has surrounding erythema and a vesicle with serous appearing fluid superior to the abrasion.       Assessment:    1. Infected abrasion of right knee, initial encounter - cephALEXin (KEFLEX) 500 MG capsule; Take 1 capsule (500 mg total) by mouth 2 (two) times daily.  Dispense: 14 capsule; Refill: 0 - SODIUM CHLORIDE, EXTERNAL, ([facility] SALINE WOUND [place]) 0.9 % SOLN; Apply 1 application topically daily.  Dispense: 210 mL; Refill: 0    Plan:    Continue daily saline cleansings and apply bandaid.

## 2017-04-28 DIAGNOSIS — Z9889 Other specified postprocedural states: Secondary | ICD-10-CM | POA: Diagnosis not present

## 2017-04-28 DIAGNOSIS — I214 Non-ST elevation (NSTEMI) myocardial infarction: Secondary | ICD-10-CM | POA: Diagnosis not present

## 2017-04-28 DIAGNOSIS — G20 Parkinson's disease: Secondary | ICD-10-CM | POA: Diagnosis not present

## 2017-04-29 ENCOUNTER — Encounter: Payer: Medicare HMO | Admitting: Family Medicine

## 2017-05-01 ENCOUNTER — Encounter: Payer: Medicare HMO | Admitting: Family Medicine

## 2017-05-01 ENCOUNTER — Encounter: Payer: Self-pay | Admitting: Family Medicine

## 2017-05-12 ENCOUNTER — Ambulatory Visit (INDEPENDENT_AMBULATORY_CARE_PROVIDER_SITE_OTHER): Payer: Medicare HMO | Admitting: Family Medicine

## 2017-05-12 ENCOUNTER — Encounter: Payer: Self-pay | Admitting: Family Medicine

## 2017-05-12 VITALS — BP 106/60 | HR 64 | Temp 98.4°F | Resp 16 | Ht 64.0 in | Wt 166.0 lb

## 2017-05-12 DIAGNOSIS — Z1231 Encounter for screening mammogram for malignant neoplasm of breast: Secondary | ICD-10-CM | POA: Diagnosis not present

## 2017-05-12 DIAGNOSIS — Z0000 Encounter for general adult medical examination without abnormal findings: Secondary | ICD-10-CM

## 2017-05-12 DIAGNOSIS — G20 Parkinson's disease: Secondary | ICD-10-CM

## 2017-05-12 DIAGNOSIS — J301 Allergic rhinitis due to pollen: Secondary | ICD-10-CM

## 2017-05-12 DIAGNOSIS — K5909 Other constipation: Secondary | ICD-10-CM | POA: Diagnosis not present

## 2017-05-12 DIAGNOSIS — M810 Age-related osteoporosis without current pathological fracture: Secondary | ICD-10-CM | POA: Diagnosis not present

## 2017-05-12 DIAGNOSIS — Z1239 Encounter for other screening for malignant neoplasm of breast: Secondary | ICD-10-CM

## 2017-05-12 DIAGNOSIS — Z85038 Personal history of other malignant neoplasm of large intestine: Secondary | ICD-10-CM | POA: Diagnosis not present

## 2017-05-12 DIAGNOSIS — Z8679 Personal history of other diseases of the circulatory system: Secondary | ICD-10-CM | POA: Diagnosis not present

## 2017-05-12 DIAGNOSIS — Z131 Encounter for screening for diabetes mellitus: Secondary | ICD-10-CM | POA: Diagnosis not present

## 2017-05-12 DIAGNOSIS — E782 Mixed hyperlipidemia: Secondary | ICD-10-CM | POA: Insufficient documentation

## 2017-05-12 DIAGNOSIS — M79604 Pain in right leg: Secondary | ICD-10-CM | POA: Diagnosis not present

## 2017-05-12 DIAGNOSIS — C4359 Malignant melanoma of other part of trunk: Secondary | ICD-10-CM

## 2017-05-12 NOTE — Patient Instructions (Signed)
We will call you with the lab results and results of bone scan Do schedule follow up with gyn as you have indicated.

## 2017-05-12 NOTE — Progress Notes (Signed)
Subjective:     Patient ID: Felicia Snyder, female   DOB: 11-26-1945, 71 y.o.   MRN: [id number]  HPI  Chief Complaint  Patient presents with  . Medicare Wellness  Continues to be followed by [facility] disorders, Dr. Mervyn Snyder: Cardiology, Felicia Snyder; [facility] dermatology, [facility] urogynecology; Dr. Vickki Snyder, Podiatry;   Review of Systems General: Feeling well; Has has Zostavax; defers Shingrix for now.  HEENT: regular dental visits and eye exams Cardiovascular: no chest pain, shortness of breath, or palpitations GI:  no heartburn. Hx of Chronic constipation with Miralax use. Hx of colon cancer with last colonoscopy in 2016 Felicia Snyder) GU: no change in bladder habits. Is starting to feel vaginal pressure again and will self refer to gyn.                                                     Neuro: no change in memory. Does not get lost while driving or leave food on the stove. Defers cognitive exam. Psychiatric: not depressed Musculoskeletal: intermittent right lower leg pain which resolves with stretching. States she takes Calcium with Vitamin D regularly. Skin: Hx of melanoma with ongoing f/u with dermatology both in [place] and at [facility].    Objective:   Physical Exam  Constitutional: She appears well-developed and well-nourished. No distress.  Eyes: PERRLA, EOMI Neck: no thyromegaly, tenderness or nodules, no carotid bruits or cervical adenopathy ENT: TM's intact without inflammation; No tonsillar enlargement or exudate Lungs: Clear Breast: no breast mass or nipple discharge, no axillary adenopathy Heart : RRR without murmur or gallop Abd: bowel sounds present, soft, non-tender, no organomegaly GU: deferred Extremities: no edema, R LE muscle strength 5/5. SLR's to 90 degrees without pain or radiation of pain. No tenderness appreciated.     Assessment:    1. Parkinson's disease [facility]): per neurology  2. H/O malignant neoplasm of colon: recent colonoscopy  3. Osteoporosis without  current pathological fracture, unspecified osteoporosis type - VITAMIN D 25 Hydroxy (Vit-D Deficiency, Fractures) - DG Bone Density; Future  4. Medicare annual wellness visit, subsequent  5. Screening for breast cancer - MM DIGITAL SCREENING BILATERAL; Future  6. History of coronary artery disease; per cardiology  7. Malignant melanoma of back [facility]): per dermatology  8. Chronic constipation  9. Allergic rhinitis due to pollen, unspecified seasonality  10. Hyperlipidemia, mixed - Lipid panel  11. Screening for diabetes mellitus - Comprehensive metabolic panel  12. Pain of right lower extremity: patient will discuss with neurology    Plan:    Further f/u pending test results. Encourage to f/u with gyn and other consultants per routine.

## 2017-05-13 ENCOUNTER — Encounter: Payer: Self-pay | Admitting: Family Medicine

## 2017-05-15 DIAGNOSIS — Z131 Encounter for screening for diabetes mellitus: Secondary | ICD-10-CM | POA: Diagnosis not present

## 2017-05-15 DIAGNOSIS — M810 Age-related osteoporosis without current pathological fracture: Secondary | ICD-10-CM | POA: Diagnosis not present

## 2017-05-15 DIAGNOSIS — E782 Mixed hyperlipidemia: Secondary | ICD-10-CM | POA: Diagnosis not present

## 2017-05-16 ENCOUNTER — Telehealth: Payer: Self-pay

## 2017-05-16 LAB — COMPREHENSIVE METABOLIC PANEL
A/G RATIO: 2.1 (ref 1.2–2.2)
ALK PHOS: 91 IU/L (ref 39–117)
ALT: 27 IU/L (ref 0–32)
AST: 34 IU/L (ref 0–40)
Albumin: 4.2 g/dL (ref 3.6–4.8)
BILIRUBIN TOTAL: 0.3 mg/dL (ref 0.0–1.2)
BUN / CREAT RATIO: 17 (ref 12–28)
BUN: 13 mg/dL (ref 8–27)
CHLORIDE: 104 mmol/L (ref 96–106)
CO2: 26 mmol/L (ref 20–29)
Calcium: 9.5 mg/dL (ref 8.7–10.3)
Creatinine, Ser: 0.76 mg/dL (ref 0.57–1.00)
GFR calc non Af Amer: 80 mL/min/{1.73_m2} (ref >59)
GFR, EST AFRICAN AMERICAN: 93 mL/min/{1.73_m2} (ref >59)
Globulin, Total: 2 g/dL (ref 1.5–4.5)
Glucose: 96 mg/dL (ref 65–99)
POTASSIUM: 4 mmol/L (ref 3.5–5.2)
Sodium: 144 mmol/L (ref 134–144)
TOTAL PROTEIN: 6.2 g/dL (ref 6.0–8.5)

## 2017-05-16 LAB — VITAMIN D 25 HYDROXY (VIT D DEFICIENCY, FRACTURES): VIT D 25 HYDROXY: 31.4 ng/mL (ref 30.0–100.0)

## 2017-05-16 LAB — LIPID PANEL
CHOLESTEROL TOTAL: 127 mg/dL (ref 100–199)
Chol/HDL Ratio: 1.8 ratio (ref 0.0–4.4)
HDL: 69 mg/dL (ref >39)
LDL Calculated: 49 mg/dL (ref 0–99)
Triglycerides: 46 mg/dL (ref 0–149)
VLDL Cholesterol Cal: 9 mg/dL (ref 5–40)

## 2017-05-16 NOTE — Telephone Encounter (Signed)
[id number] 

## 2017-05-16 NOTE — Telephone Encounter (Signed)
Patient has been advised. KW 

## 2017-05-16 NOTE — Telephone Encounter (Signed)
-----   Message from Carmon Ginsberg, [place] sent at 05/16/2017  7:35 AM EDT ----- Labs are good. Vitamin D is borderline low. May wish supplement of [phone] units daily

## 2017-05-22 DIAGNOSIS — G20 Parkinson's disease: Secondary | ICD-10-CM | POA: Diagnosis not present

## 2017-05-29 DIAGNOSIS — H43812 Vitreous degeneration, left eye: Secondary | ICD-10-CM | POA: Diagnosis not present

## 2017-05-29 DIAGNOSIS — I7090 Unspecified atherosclerosis: Secondary | ICD-10-CM | POA: Diagnosis not present

## 2017-05-29 DIAGNOSIS — H2513 Age-related nuclear cataract, bilateral: Secondary | ICD-10-CM | POA: Diagnosis not present

## 2017-05-29 DIAGNOSIS — H25013 Cortical age-related cataract, bilateral: Secondary | ICD-10-CM | POA: Diagnosis not present

## 2017-06-04 ENCOUNTER — Encounter: Payer: Self-pay | Admitting: Family Medicine

## 2017-06-25 DIAGNOSIS — Z1283 Encounter for screening for malignant neoplasm of skin: Secondary | ICD-10-CM | POA: Diagnosis not present

## 2017-06-25 DIAGNOSIS — L570 Actinic keratosis: Secondary | ICD-10-CM | POA: Diagnosis not present

## 2017-06-25 DIAGNOSIS — Z85820 Personal history of malignant melanoma of skin: Secondary | ICD-10-CM | POA: Diagnosis not present

## 2017-06-25 DIAGNOSIS — X32XXXD Exposure to sunlight, subsequent encounter: Secondary | ICD-10-CM | POA: Diagnosis not present

## 2017-06-25 DIAGNOSIS — Z08 Encounter for follow-up examination after completed treatment for malignant neoplasm: Secondary | ICD-10-CM | POA: Diagnosis not present

## 2017-07-02 ENCOUNTER — Ambulatory Visit
Admission: RE | Admit: 2017-07-02 | Discharge: 2017-07-02 | Disposition: A | Discharge status: Home / Self Care | Payer: Medicare HMO | Source: Ambulatory Visit | Attending: Family Medicine | Admitting: Family Medicine

## 2017-07-02 ENCOUNTER — Encounter: Payer: Self-pay | Admitting: Radiology

## 2017-07-02 DIAGNOSIS — Z1231 Encounter for screening mammogram for malignant neoplasm of breast: Secondary | ICD-10-CM | POA: Diagnosis not present

## 2017-07-02 DIAGNOSIS — Z1239 Encounter for other screening for malignant neoplasm of breast: Secondary | ICD-10-CM

## 2017-07-02 DIAGNOSIS — M810 Age-related osteoporosis without current pathological fracture: Secondary | ICD-10-CM | POA: Insufficient documentation

## 2017-07-02 DIAGNOSIS — M85851 Other specified disorders of bone density and structure, right thigh: Secondary | ICD-10-CM | POA: Diagnosis not present

## 2017-07-03 ENCOUNTER — Telehealth: Payer: Self-pay

## 2017-07-03 NOTE — Telephone Encounter (Signed)
-----   Message from Carmon Ginsberg, [place] sent at 07/03/2017  7:41 AM EDT ----- Bone thinning without osteoporosis. Continue 1200 mg. Calcium with 800 units of Vitamin D daily

## 2017-07-03 NOTE — Telephone Encounter (Signed)
Pt advised.   Thanks,   -Laura  

## 2017-11-12 DIAGNOSIS — G20 Parkinson's disease: Secondary | ICD-10-CM | POA: Diagnosis not present

## 2017-11-12 DIAGNOSIS — I214 Non-ST elevation (NSTEMI) myocardial infarction: Secondary | ICD-10-CM | POA: Diagnosis not present

## 2017-11-12 DIAGNOSIS — Z9889 Other specified postprocedural states: Secondary | ICD-10-CM | POA: Diagnosis not present

## 2017-11-12 DIAGNOSIS — R079 Chest pain, unspecified: Secondary | ICD-10-CM | POA: Diagnosis not present

## 2017-11-19 DIAGNOSIS — R32 Unspecified urinary incontinence: Secondary | ICD-10-CM | POA: Diagnosis not present

## 2017-11-19 DIAGNOSIS — I252 Old myocardial infarction: Secondary | ICD-10-CM | POA: Diagnosis not present

## 2017-11-19 DIAGNOSIS — Z823 Family history of stroke: Secondary | ICD-10-CM | POA: Diagnosis not present

## 2017-11-19 DIAGNOSIS — J309 Allergic rhinitis, unspecified: Secondary | ICD-10-CM | POA: Diagnosis not present

## 2017-11-19 DIAGNOSIS — K5900 Constipation, unspecified: Secondary | ICD-10-CM | POA: Diagnosis not present

## 2017-11-19 DIAGNOSIS — Z803 Family history of malignant neoplasm of breast: Secondary | ICD-10-CM | POA: Diagnosis not present

## 2017-11-19 DIAGNOSIS — G20 Parkinson's disease: Secondary | ICD-10-CM | POA: Diagnosis not present

## 2017-11-19 DIAGNOSIS — E785 Hyperlipidemia, unspecified: Secondary | ICD-10-CM | POA: Diagnosis not present

## 2017-11-19 DIAGNOSIS — Z7902 Long term (current) use of antithrombotics/antiplatelets: Secondary | ICD-10-CM | POA: Diagnosis not present

## 2017-11-19 DIAGNOSIS — I2510 Atherosclerotic heart disease of native coronary artery without angina pectoris: Secondary | ICD-10-CM | POA: Diagnosis not present

## 2017-12-11 DIAGNOSIS — G20 Parkinson's disease: Secondary | ICD-10-CM | POA: Diagnosis not present

## 2018-01-06 DIAGNOSIS — C4361 Malignant melanoma of right upper limb, including shoulder: Secondary | ICD-10-CM | POA: Diagnosis not present

## 2018-01-07 ENCOUNTER — Ambulatory Visit: Payer: Medicare HMO | Attending: Psychiatry | Admitting: Occupational Therapy

## 2018-01-07 ENCOUNTER — Encounter: Payer: Self-pay | Admitting: Occupational Therapy

## 2018-01-07 ENCOUNTER — Other Ambulatory Visit: Payer: Self-pay

## 2018-01-07 DIAGNOSIS — R2681 Unsteadiness on feet: Secondary | ICD-10-CM

## 2018-01-07 DIAGNOSIS — R278 Other lack of coordination: Secondary | ICD-10-CM | POA: Insufficient documentation

## 2018-01-07 DIAGNOSIS — R262 Difficulty in walking, not elsewhere classified: Secondary | ICD-10-CM | POA: Insufficient documentation

## 2018-01-07 DIAGNOSIS — M6281 Muscle weakness (generalized): Secondary | ICD-10-CM | POA: Diagnosis not present

## 2018-01-08 ENCOUNTER — Ambulatory Visit: Payer: Medicare HMO | Admitting: Occupational Therapy

## 2018-01-08 DIAGNOSIS — R2681 Unsteadiness on feet: Secondary | ICD-10-CM

## 2018-01-08 DIAGNOSIS — M6281 Muscle weakness (generalized): Secondary | ICD-10-CM | POA: Diagnosis not present

## 2018-01-08 DIAGNOSIS — R262 Difficulty in walking, not elsewhere classified: Secondary | ICD-10-CM

## 2018-01-08 DIAGNOSIS — R278 Other lack of coordination: Secondary | ICD-10-CM | POA: Diagnosis not present

## 2018-01-09 ENCOUNTER — Ambulatory Visit: Payer: Medicare HMO | Admitting: Occupational Therapy

## 2018-01-09 DIAGNOSIS — R278 Other lack of coordination: Secondary | ICD-10-CM

## 2018-01-09 DIAGNOSIS — M6281 Muscle weakness (generalized): Secondary | ICD-10-CM | POA: Diagnosis not present

## 2018-01-09 DIAGNOSIS — R262 Difficulty in walking, not elsewhere classified: Secondary | ICD-10-CM

## 2018-01-09 DIAGNOSIS — R2681 Unsteadiness on feet: Secondary | ICD-10-CM

## 2018-01-12 ENCOUNTER — Ambulatory Visit: Payer: Medicare HMO | Admitting: Occupational Therapy

## 2018-01-12 DIAGNOSIS — R278 Other lack of coordination: Secondary | ICD-10-CM

## 2018-01-12 DIAGNOSIS — R2681 Unsteadiness on feet: Secondary | ICD-10-CM | POA: Diagnosis not present

## 2018-01-12 DIAGNOSIS — M6281 Muscle weakness (generalized): Secondary | ICD-10-CM | POA: Diagnosis not present

## 2018-01-12 DIAGNOSIS — R262 Difficulty in walking, not elsewhere classified: Secondary | ICD-10-CM

## 2018-01-14 ENCOUNTER — Ambulatory Visit: Payer: Medicare HMO | Admitting: Occupational Therapy

## 2018-01-14 DIAGNOSIS — R2681 Unsteadiness on feet: Secondary | ICD-10-CM | POA: Diagnosis not present

## 2018-01-14 DIAGNOSIS — R262 Difficulty in walking, not elsewhere classified: Secondary | ICD-10-CM | POA: Diagnosis not present

## 2018-01-14 DIAGNOSIS — R278 Other lack of coordination: Secondary | ICD-10-CM | POA: Diagnosis not present

## 2018-01-14 DIAGNOSIS — M6281 Muscle weakness (generalized): Secondary | ICD-10-CM

## 2018-01-15 ENCOUNTER — Encounter: Payer: Self-pay | Admitting: Occupational Therapy

## 2018-01-15 NOTE — Therapy (Signed)
[place] MAIN [facility] SERVICES [address] [place], [place], [address] Phone: [phone]   Fax:  [phone]  Occupational Therapy Treatment  Patient Details  Name: Shirlette Scarber MRN: [id number] Date of Birth: [birth year] Referring Provider: Lezlie Octave   Encounter Date: 01/08/2018  OT End of Session - 01/15/18 2014    Visit Number  2    Number of Visits  17    Date for OT Re-Evaluation  02/06/18    OT Start Time  9518    OT Stop Time  1540    OT Time Calculation (min)  55 min    Activity Tolerance  Patient tolerated treatment well    Behavior During Therapy  [facility] for tasks assessed/performed       Past Medical History:  Diagnosis Date  . Cancer [facility]) 1996   colon cancer  . Premature osteoporosis     Past Surgical History:  Procedure Laterality Date  . Broken Ankle  2011  . COLECTOMY    . Ruptured Disk  [id number]  . TOOTH EXTRACTION    . TOTAL ABDOMINAL HYSTERECTOMY  2016    There were no vitals filed for this visit.  Subjective Assessment - 01/15/18 2009    Subjective   Patient report she is ready to get started.  She is familiar with exercises from the past but has not performed them in a while.     Pertinent History  Patient reports she feels like she is leaning forwards more now with her shoulder back and head up, which is affecting her balance. No recent falls but tripped and almost fell.      Patient Stated Goals  Patient would like her posture to be better, move better.    Currently in Pain?  No/denies    Pain Score  0-No pain                   OT Treatments/Exercises (OP) - 01/15/18 2012      Neurological Re-education Exercises   Other Exercises 1  Patient seen for instruction of LSVT BIG exercises: LSVT Daily Session Maximal Daily Exercises: Sustained movements are designed to rescale the amplitude of movement output for generalization to daily functional activities. Performed as follows for 1  set of 10 repetitions each: Multi directional sustained movements- 1) Floor to ceiling, 2) Side to side. Multi directional Repetitive movements performed in standing and are designed to provide retraining effort needed for sustained muscle activation in tasks Performed as follows: 3) Step and reach forward, 4) Step and Reach Backwards, 5) Step and reach sideways, 6) Rock and reach forward/backward, 7) Rock and reach sideways. All exercises in standing performed with SBA and cues as needed along with therapist demonstration. Sit to stand from mat table on lowest setting with cues for weight shift, technique and SBA for 10 reps for 1 set.   Patient performing reciprocal stepping exercise with SBA and cues for 10 reps each foot, stair negotiation 4 steps for 5 reps each, cues for big turns, SBA.     Other Exercises 2  Functional mobility indoors this date for 2 trials of 500 feet with cues for reciprocal arm swing, amplitude of steps and posture.              OT Education - 01/15/18 2014    Education provided  Yes    Education Details  LSVT BIG maximal daily exercises, posture     Person(s) Educated  Patient  Methods  Explanation;Demonstration;Verbal cues    Comprehension  Verbal cues required;Returned demonstration;Verbalized understanding          OT Long Term Goals - 01/15/18 1139      OT LONG TERM GOAL #1   Title  Patient will improve gait speed and endurance and be able to walk 1550 feet in 6 minutes to negotiate around the home and community safely in 4 weeks    Baseline  6 minute walk test at evaluation 1405 feet    Time  4    Period  Weeks    Status  New    Target Date  02/06/18      OT LONG TERM GOAL #2   Title  Patient will complete HEP for maximal daily exercises with modified independence in 4 weeks      Baseline  has not been following program in recent months    Time  4    Period  Weeks    Status  New    Target Date  02/06/18      OT LONG TERM GOAL #3   Title   Patient will transfer from sit to stand without the use of arms safely and independently from a variety of chairs/surfaces in 4 weeks.    Baseline  increased difficulty from lower surfaces    Time  4    Period  Weeks    Target Date  02/06/18      OT LONG TERM GOAL #4   Title  Patient will decrease frequency of freezing episodes with score of 7 or less on Freezing of Gait questionnaire.       Baseline  score of 8 on eval     Time  4    Period  Weeks    Status  New    Target Date  02/06/18            Plan - 01/15/18 2015    Clinical Impression Statement  Patient familiar with exercises from a past episode of care, she was able to complete exercises with therapist demonstration, verbal cues for form and technique and SBA.  Will plan to add complexity to exercises to further challenge patient in coming sessions.  She continues to require cues for reciprocal arm swing with functional mobility as well as posture during exercises and mobility which is currently affecting her balance. Continue to work towards goals in [place].    Occupational Profile and client history currently impacting functional performance  Patient with progressive disease process with recent slight decline in functional mobility, has not been consistent recently with daily exercises, near falls, forwards flexed posture, decreased balance.    Occupational performance deficits (Please refer to evaluation for details):  ADL's;IADL's;Leisure;Social Participation    Rehab Potential  Good    Current Impairments/barriers affecting progress:  positive:  motivation, support.  Negative: progressive disease process, frozen shoulder in the past on R, R foot nerve damage, tremors in RUE at times    OT Frequency  4x / week    OT Duration  4 weeks    OT Treatment/Interventions  Self-care/ADL training;Therapeutic exercise;Gait Training;Neuromuscular education;Stair Training;Patient/family education;Therapist, nutritional;Therapeutic  activities;Balance training;DME and/or AE instruction    Consulted and Agree with Plan of Care  Patient       Patient will benefit from skilled therapeutic intervention in order to improve the following deficits and impairments:  Abnormal gait, Improper body mechanics, Decreased knowledge of use of DME, Decreased strength, Impaired flexibility, Decreased balance, Decreased  mobility, Difficulty walking, Decreased range of motion, Decreased coordination, Impaired UE functional use, Decreased endurance, Decreased activity tolerance  Visit Diagnosis: Difficulty in walking, not elsewhere classified  Unsteadiness on feet  Muscle weakness (generalized)  Other lack of coordination    Problem List Patient Active Problem List   Diagnosis Date Noted  . Hyperlipidemia, mixed 05/12/2017  . Allergic rhinitis 05/03/2016  . Herpes simplex 05/03/2016  . 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 OP (osteoporosis) 05/03/2016  . Acute myocardial infarction ([place]) 10/02/2015  . History of cardiac catheterization 10/02/2015  . Incomplete uterine prolapse 12/27/2014  . Chronic constipation 12/26/2014  . H/O malignant neoplasm of colon 10/13/2014  . Parkinsonism ([place]) 05/04/2013  . Parkinson's disease ([place]) 05/04/2013   [place] Ducre T Tomasita Morrow, OTR/L, CLT  Warden Buffa 01/15/2018, 8:18 PM  [place] MAIN [facility] SERVICES [address] [place], [place], [address] Phone: [phone]   Fax:  [phone]  Name: Taleia Sadowski MRN: [id number] Date of Birth: 1946-09-10

## 2018-01-15 NOTE — Therapy (Signed)
[name] [facility] SERVICES [address] [place], [place], [address] Phone: [phone]   Fax:  [phone]  Occupational Therapy Evaluation  Patient Details  Name: Felicia Snyder MRN: [id number] Date of Birth: 02-Dec-1945 Referring Provider: Lezlie Octave   Encounter Date: 01/07/2018  OT End of Session - 01/15/18 1133    Visit Number  1    Number of Visits  17    Date for OT Re-Evaluation  02/06/18    OT Start Time  0900    OT Stop Time  1000    OT Time Calculation (min)  60 min    Activity Tolerance  Patient tolerated treatment well    Behavior During Therapy  [facility] for tasks assessed/performed       Past Medical History:  Diagnosis Date  . Cancer [facility]) 1996   colon cancer  . Premature osteoporosis     Past Surgical History:  Procedure Laterality Date  . Broken Ankle  2011  . COLECTOMY    . Ruptured Disk  [id number]  . TOOTH EXTRACTION    . TOTAL ABDOMINAL HYSTERECTOMY  2016    There were no vitals filed for this visit.  Subjective Assessment - 01/14/18 1123    Subjective   Patient reports she has not been consistent with exercises, needs to work on posture, neck and arm swing.    Pertinent History  Patient reports she feels like she is leaning forwards more now with her shoulder back and head up, which is affecting her balance. No recent falls but tripped and almost fell.      Patient Stated Goals  Patient would like her posture to be better, move better.    Currently in Pain?  No/denies    Pain Score  0-No pain        [facility] OT Assessment - 01/14/18 1124      Assessment   Medical Diagnosis  Parkinson's disease    Referring Provider  Lezlie Octave    Onset Date/Surgical Date  05/04/12    Hand Dominance  Right    Prior Therapy  OT      Balance Screen   Has the patient fallen in the past 6 months  No    Has the patient had a decrease in activity level because of a fear of falling?   No    Is the patient  reluctant to leave their home because of a fear of falling?   No      Home  Environment   Family/patient expects to be discharged to:  Private residence    Living Arrangements  Spouse/significant other    Available Help at Discharge  Family    Type of [place] - Number of Steps  14    Bathroom Shower/Tub  Walk-in Designer, industrial/product    Lives With  Spouse      Prior Function   Level of [place]  Retired      ADL   Eating/Feeding  Modified independent    Grooming  Modified independent    Upper Body Bathing  Modified independent    Lower Body Bathing  Modified independent    Upper Body Dressing  Independent    Lower Body Dressing  Increased time    Toilet Transfer  Modified independent  Toileting - Clothing Manipulation  Modified independent    Toileting -  Hygiene  Modified Independent    Tub/Shower Transfer  Modified independent    ADL comments  Patient reports it is getting more difficult to reach her feet to put on her shoes and to clip her toenails.  She also has a harder time getting food loaded on the fork and then to mouth without spillage. Patient has occasional tremors in RUE, She also reports she bumps into items frequently and bruises easily.        IADL   Prior Level of Function Shopping  independent    Shopping  Shops independently for small purchases    Prior Level of Function Light Housekeeping  independent    Light Housekeeping  Performs light daily tasks such as dishwashing, bed making    Prior Level of Function Meal Prep  independent    Meal Prep  Able to complete simple warm meal prep    Prior Level of Function Metallurgist own vehicle    Prior Level of Function Medication Managment  independent    Medication Management  Is responsible for taking medication in correct dosages at  correct time    Prior Level of Function Financial Management  independent      Mobility   Mobility Status  Independent    Mobility Status Comments  Patient demonstrates decreased step length with functional gait especially when fatigued, decreased reciprocal arm swing and decreased balance with unilateral stance, tandem stance, has had a couple of close calls for falling recently and often bumps into things.       Written Expression   Dominant Hand  Right      Vision - History   Baseline Vision  Wears glasses all the time    Additional Comments  some increased bluriness and is planning to go back to doctor, has beginnings of cataracts but not yet ready to be removed.       Cognition   Overall Cognitive Status  Within Functional Limits for tasks assessed      Sensation   Light Touch  Appears Intact    Stereognosis  Appears Intact    Hot/Cold  Appears Intact    Proprioception  Appears Intact      Coordination   Gross Motor Movements are Fluid and Coordinated  No    Fine Motor Movements are Fluid and Coordinated  No    Finger Nose Finger Test  mild impairment noted    9 Hole Peg Test  Right;Left      ROM / Strength   AROM / PROM / Strength  AROM;Strength      AROM   Overall AROM   Deficits    Overall AROM Comments  right shoulder flexion 130 degrees, ABD 125 degrees      Strength   Overall Strength  Deficits    Overall Strength Comments  Strength 4-/5 overall       Hand Function   Right Hand Grip (lbs)  45    Right Hand Lateral Pinch  16 lbs    Right Hand 3 Point Pinch  15 lbs    Left Hand Grip (lbs)  36    Left Hand Lateral Pinch  13 lbs    Left 3 point pinch  14 lbs        5 times sit to stand 15 secs 6 minute walk test 1405 feet BERG balance test  51/56 FOG 8   Patient seen for instruction of maximal daily exercises LSVT BIG with written and pictorial instructions.  Patient demonstrating with SBA and cues for form and technique.             OT  Education - 01/15/18 1133    Education provided  Yes    Education Details  LSVT BIG, goals, POC    Person(s) Educated  Patient    Methods  Explanation    Comprehension  Verbalized understanding          OT Long Term Goals - 01/15/18 1139      OT LONG TERM GOAL #1   Title  Patient will improve gait speed and endurance and be able to walk 1550 feet in 6 minutes to negotiate around the home and community safely in 4 weeks    Baseline  6 minute walk test at evaluation 1405 feet    Time  4    Period  Weeks    Status  New    Target Date  02/06/18      OT LONG TERM GOAL #2   Title  Patient will complete HEP for maximal daily exercises with modified independence in 4 weeks      Baseline  has not been following program in recent months    Time  4    Period  Weeks    Status  New    Target Date  02/06/18      OT LONG TERM GOAL #3   Title  Patient will transfer from sit to stand without the use of arms safely and independently from a variety of chairs/surfaces in 4 weeks.    Baseline  increased difficulty from lower surfaces    Time  4    Period  Weeks    Target Date  02/06/18      OT LONG TERM GOAL #4   Title  Patient will decrease frequency of freezing episodes with score of 7 or less on Freezing of Gait questionnaire.       Baseline  score of 8 on eval     Time  4    Period  Weeks    Status  New    Target Date  02/06/18            Plan - 01/15/18 1134    Clinical Impression Statement  Patient is a 72 yo female diagnosed with Parkinson's disease and was referred by her physician for LSVT BIG program. Patient presents with forwards flexed posture, occasional tremors in the right hand, decreased step length with gait patterns, decreased reciprocal arm swing, decreased balance, freezing of gait occasionally with initiation of gait as well as with turns, decreased coordination, and muscle strength which affect her ability to perform daily tasks. She has not been performing  daily exercises and has noted a recent decline in status.  The patient is judged to be an excellent candidate for the LSVT BIG program. She would benefit from and was referred for the LSVT BIG program which is an intensive program designed specifically for Parkinson's patients with a focus on increasing amplitude and speed of movements, improving self-care and daily tasks and providing patients with daily exercises to improve overall function. It is recommended that the patient receive the LSVT BIG program which is comprised of 16 intensive sessions (4 times per week for 4 weeks, one hour sessions). Prognosis for improvement is good based on her motivation and family support. LSVT BIG has  been documented in the literature as efficacious for individuals with Parkinson's disease.        Occupational Profile and client history currently impacting functional performance  Patient with progressive disease process with recent slight decline in functional mobility, has not been consistent recently with daily exercises, near falls, forwards flexed posture, decreased balance.    Occupational performance deficits (Please refer to evaluation for details):  ADL's;IADL's;Leisure;Social Participation    Rehab Potential  Good    Current Impairments/barriers affecting progress:  positive:  motivation, support.  Negative: progressive disease process, frozen shoulder in the past on R, R foot nerve damage, tremors in RUE at times    OT Frequency  4x / week    OT Duration  4 weeks    OT Treatment/Interventions  Self-care/ADL training;Therapeutic exercise;Gait Training;Neuromuscular education;Stair Training;Patient/family education;Therapist, nutritional;Therapeutic activities;Balance training;DME and/or AE instruction    Plan  LSVT BIG protocol for evaluation plus 16 treatment sessions.     Clinical Decision Making  Limited treatment options, no task modification necessary    Consulted and Agree with Plan of Care   Patient       Patient will benefit from skilled therapeutic intervention in order to improve the following deficits and impairments:  Abnormal gait, Improper body mechanics, Decreased knowledge of use of DME, Decreased strength, Impaired flexibility, Decreased balance, Decreased mobility, Difficulty walking, Decreased range of motion, Decreased coordination, Impaired UE functional use, Decreased endurance, Decreased activity tolerance  Visit Diagnosis: Difficulty in walking, not elsewhere classified  Unsteadiness on feet  Muscle weakness (generalized)  Other lack of coordination    Problem List Patient Active Problem List   Diagnosis Date Noted  . Hyperlipidemia, mixed 05/12/2017  . Allergic rhinitis 05/03/2016  . Herpes simplex 05/03/2016  . 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 OP (osteoporosis) 05/03/2016  . Acute myocardial infarction ([place]) 10/02/2015  . History of cardiac catheterization 10/02/2015  . Incomplete uterine prolapse 12/27/2014  . Chronic constipation 12/26/2014  . H/O malignant neoplasm of colon 10/13/2014  . Parkinsonism ([place]) 05/04/2013  . Parkinson's disease ([place]) 05/04/2013   Amy T Tomasita Morrow, OTR/L, CLT  Lovett,Amy 01/15/2018, 11:42 AM  [place] MAIN [facility] SERVICES [address] [place], [place], [address] Phone: [phone]   Fax:  [phone]  Name: Felicia Snyder MRN: [id number] Date of Birth: 28-Jan-1946

## 2018-01-16 ENCOUNTER — Encounter: Payer: Self-pay | Admitting: Occupational Therapy

## 2018-01-16 NOTE — Therapy (Signed)
[place] MAIN [facility] SERVICES [address] [place], [place], [address] Phone: [phone]   Fax:  [phone]  Occupational Therapy Treatment  Patient Details  Name: Felicia Snyder MRN: [id number] Date of Birth: [birth year] Referring Provider: Lezlie Octave   Encounter Date: 01/09/2018  OT End of Session - 01/15/18 2024    Visit Number  3    Number of Visits  17    Date for OT Re-Evaluation  02/06/18    OT Start Time  1000    OT Stop Time  1058    OT Time Calculation (min)  58 min    Activity Tolerance  Patient tolerated treatment well    Behavior During Therapy  [facility] for tasks assessed/performed       Past Medical History:  Diagnosis Date  . Cancer [facility]) 1996   colon cancer  . Premature osteoporosis     Past Surgical History:  Procedure Laterality Date  . Broken Ankle  2011  . COLECTOMY    . Ruptured Disk  [id number]  . TOOTH EXTRACTION    . TOTAL ABDOMINAL HYSTERECTOMY  2016    There were no vitals filed for this visit.  Subjective Assessment - 01/15/18 2022    Subjective   Patient denies any pain or soreness from performing exercises yesterday.  She is aware she will need to perform 2 times a day during intensive portion of program.     Pertinent History  Patient reports she feels like she is leaning forwards more now with her shoulder back and head up, which is affecting her balance. No recent falls but tripped and almost fell.      Patient Stated Goals  Patient would like her posture to be better, move better.    Currently in Pain?  No/denies    Pain Score  0-No pain                   OT Treatments/Exercises (OP) - 01/15/18 2025      Neurological Re-education Exercises   Other Exercises 1  Patient seen for instruction of LSVT BIG exercises: LSVT Daily Session Maximal Daily Exercises: Sustained movements are designed to rescale the amplitude of movement output for generalization to daily functional  activities. Performed as follows for 1 set of 10 repetitions each: Multi directional sustained movements- 1) Floor to ceiling, 2) Side to side. Multi directional Repetitive movements performed in standing and are designed to provide retraining effort needed for sustained muscle activation in tasks Performed as follows: 3) Step and reach forward, 4) Step and Reach Backwards, 5) Step and reach sideways, 6) Rock and reach forward/backward, 7) Rock and reach sideways. All exercises in standing performed with SBA and cues as needed along with therapist demonstration. Sit to stand from mat table on lowest setting with cues for weight shift, technique and SBA for 10 reps for 1 set.   Patient performing reciprocal stepping exercise with SBA and cues for 10 reps each foot, stair negotiation 4 steps for 5 reps each, cues for big turns, SBA.     Other Exercises 2  Patient seen for balance tasks in standing with emphasis on single leg stance, tandem stance and reciprocal stepping patterns with CGA for balance. Functional mobility for 800 feet with cues for arm swing and increasing amplitude of gait.              OT Education - 01/15/18 2023    Education provided  Yes  Education Details  LSVT BIG exercises, posture exercises, reciprocal arm swing    Person(s) Educated  Patient    Methods  Explanation;Demonstration;Verbal cues    Comprehension  Verbal cues required;Returned demonstration;Verbalized understanding          OT Long Term Goals - 01/15/18 1139      OT LONG TERM GOAL #1   Title  Patient will improve gait speed and endurance and be able to walk 1550 feet in 6 minutes to negotiate around the home and community safely in 4 weeks    Baseline  6 minute walk test at evaluation 1405 feet    Time  4    Period  Weeks    Status  New    Target Date  02/06/18      OT LONG TERM GOAL #2   Title  Patient will complete HEP for maximal daily exercises with modified independence in 4 weeks       Baseline  has not been following program in recent months    Time  4    Period  Weeks    Status  New    Target Date  02/06/18      OT LONG TERM GOAL #3   Title  Patient will transfer from sit to stand without the use of arms safely and independently from a variety of chairs/surfaces in 4 weeks.    Baseline  increased difficulty from lower surfaces    Time  4    Period  Weeks    Target Date  02/06/18      OT LONG TERM GOAL #4   Title  Patient will decrease frequency of freezing episodes with score of 7 or less on Freezing of Gait questionnaire.       Baseline  score of 8 on eval     Time  4    Period  Weeks    Status  New    Target Date  02/06/18            Plan - 01/15/18 2029    Clinical Impression Statement  Patient progressing well with standard LSVT BIG maximal daily exercises, will plan to add next level of complexity next session since patient is doing well and could benefit from increased challenge and advancement to exercises.  She is more conscious of her posture and responds well to cues and has been instructed on wall exercises to add to her home program.  Patient does require cues at times for reciprocal arm swing especially when distracted during session.  Continue to reinforce LSVT BIG principles for movement and work towards goals to decrease risk of falls and improve quality of movement for functional mobility.    Occupational Profile and client history currently impacting functional performance  Patient with progressive disease process with recent slight decline in functional mobility, has not been consistent recently with daily exercises, near falls, forwards flexed posture, decreased balance.    Occupational performance deficits (Please refer to evaluation for details):  ADL's;IADL's;Leisure;Social Participation    Rehab Potential  Good    Current Impairments/barriers affecting progress:  positive:  motivation, support.  Negative: progressive disease process,  frozen shoulder in the past on R, R foot nerve damage, tremors in RUE at times    OT Frequency  4x / week    OT Duration  4 weeks    OT Treatment/Interventions  Self-care/ADL training;Therapeutic exercise;Gait Training;Neuromuscular education;Stair Training;Patient/family education;Therapist, nutritional;Therapeutic activities;Balance training;DME and/or AE instruction    Consulted and  Agree with Plan of Care  Patient       Patient will benefit from skilled therapeutic intervention in order to improve the following deficits and impairments:  Abnormal gait, Improper body mechanics, Decreased knowledge of use of DME, Decreased strength, Impaired flexibility, Decreased balance, Decreased mobility, Difficulty walking, Decreased range of motion, Decreased coordination, Impaired UE functional use, Decreased endurance, Decreased activity tolerance  Visit Diagnosis: Difficulty in walking, not elsewhere classified  Unsteadiness on feet  Muscle weakness (generalized)  Other lack of coordination    Problem List Patient Active Problem List   Diagnosis Date Noted  . Hyperlipidemia, mixed 05/12/2017  . Allergic rhinitis 05/03/2016  . Herpes simplex 05/03/2016  . 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 OP (osteoporosis) 05/03/2016  . Acute myocardial infarction ([place]) 10/02/2015  . History of cardiac catheterization 10/02/2015  . Incomplete uterine prolapse 12/27/2014  . Chronic constipation 12/26/2014  . H/O malignant neoplasm of colon 10/13/2014  . Parkinsonism ([place]) 05/04/2013  . Parkinson's disease ([place]) 05/04/2013   Yocelin Vanlue T Tomasita Morrow, OTR/L, CLT  Flossie Wexler 01/16/2018, 8:46 AM  [place] MAIN [facility] SERVICES [address] [place], [place], [address] Phone: [phone]   Fax:  [phone]  Name: Maxie Debose MRN: [id number] Date of Birth: [birth year]

## 2018-01-16 NOTE — Therapy (Signed)
[place] MAIN [facility] SERVICES [address] [place], [place], [address] Phone: [phone]   Fax:  [phone]  Occupational Therapy Treatment  Patient Details  Name: Felicia Snyder MRN: [id number] Date of Birth: [birth year] Referring Provider: Lezlie Octave   Encounter Date: 01/12/2018  OT End of Session - 01/16/18 1018    Visit Number  4    Number of Visits  17    Date for OT Re-Evaluation  02/06/18    OT Start Time  1500    OT Stop Time  1556    OT Time Calculation (min)  56 min    Activity Tolerance  Patient tolerated treatment well    Behavior During Therapy  [facility] for tasks assessed/performed       Past Medical History:  Diagnosis Date  . Cancer [facility]) 1996   colon cancer  . Premature osteoporosis     Past Surgical History:  Procedure Laterality Date  . Broken Ankle  2011  . COLECTOMY    . Ruptured Disk  [id number]  . TOOTH EXTRACTION    . TOTAL ABDOMINAL HYSTERECTOMY  2016    There were no vitals filed for this visit.  Subjective Assessment - 01/16/18 1013    Subjective   Patient reports her exercises went well over the weekend, she is focusing on posture during exercises and walking, still feels she needs additional balance activities.    Pertinent History  Patient reports she feels like she is leaning forwards more now with her shoulder back and head up, which is affecting her balance. No recent falls but tripped and almost fell.      Patient Stated Goals  Patient would like her posture to be better, move better.    Currently in Pain?  No/denies    Pain Score  0-No pain    Multiple Pain Sites  No                   OT Treatments/Exercises (OP) - 01/16/18 1014      Neurological Re-education Exercises   Other Exercises 1  Patient seen for instruction of LSVT BIG exercises: LSVT Daily Session Maximal Daily Exercises: Sustained movements are designed to rescale the amplitude of movement output for  generalization to daily functional activities. Performed as follows for 1 set of 10 repetitions each: Multi directional sustained movements- 1) Floor to ceiling, 2) Side to side. Multi directional Repetitive movements performed in standing and are designed to provide retraining effort needed for sustained muscle activation in tasks Performed as follows: 3) Step and reach forward, 4) Step and Reach Backwards, 5) Step and reach sideways, 6) Rock and reach forward/backward, 7) Rock and reach sideways. All exercises in standing performed with SBA and cues as needed along with therapist demonstration. Added complexity to exercises today with use of alternating sides with cues and therapist demo. Sit to stand from mat table on lowest setting with cues for weight shift, technique and SBA for 10 reps for 1 set.   Patient performing reciprocal stepping exercise with SBA and cues for 10 reps each foot, stair negotiation 4 steps for 5 reps each, cues for big turns, SBA.    Other Exercises 2  Posture exercises at the wall with cues, balance tasks in standing in parallel bars with use of BOSU ball with flat side up, CGA and use of parallel bars as needed for balance.  Functional mobility for 800 feet with cues for reciprocal arm swing and increasing  amplitude of gait.             OT Education - 01/16/18 1017    Education provided  Yes    Education Details  alternating sides with exercises this date to increase complexity of task.    Person(s) Educated  Patient    Methods  Explanation;Demonstration;Verbal cues    Comprehension  Verbal cues required;Returned demonstration;Verbalized understanding          OT Long Term Goals - 01/16/18 1018      OT LONG TERM GOAL #1   Title  Patient will improve gait speed and endurance and be able to walk 1550 feet in 6 minutes to negotiate around the home and community safely in 4 weeks    Baseline  6 minute walk test at evaluation 1405 feet    Time  4    Period   Weeks    Status  On-going      OT LONG TERM GOAL #2   Title  Patient will complete HEP for maximal daily exercises with modified independence in 4 weeks      Baseline  has not been following program in recent months    Time  4    Period  Weeks    Status  On-going      OT LONG TERM GOAL #3   Title  Patient will transfer from sit to stand without the use of arms safely and independently from a variety of chairs/surfaces in 4 weeks.    Baseline  increased difficulty from lower surfaces    Time  4    Period  Weeks    Status  On-going      OT LONG TERM GOAL #4   Title  Patient will decrease frequency of freezing episodes with score of 7 or less on Freezing of Gait questionnaire.       Baseline  score of 8 on eval     Time  4    Period  Weeks    Status  On-going      OT LONG TERM GOAL #5   Status  On-going            Plan - 01/16/18 1018    Clinical Impression Statement  Since patient is familiar with LSVT BIG daily exercises from the past, she has been able to advance quickly, added complexity to exercises this date with first level of alternating sides.  Pt responds well to cues and is making an effort to correct her posture during exercises, mobility and during functional tasks at home.  Continue to work on improving balance, functional mobiility, and decreasing fall risk.    Occupational Profile and client history currently impacting functional performance  Patient with progressive disease process with recent slight decline in functional mobility, has not been consistent recently with daily exercises, near falls, forwards flexed posture, decreased balance.    Occupational performance deficits (Please refer to evaluation for details):  ADL's;IADL's;Leisure;Social Participation    Rehab Potential  Good    Current Impairments/barriers affecting progress:  positive:  motivation, support.  Negative: progressive disease process, frozen shoulder in the past on R, R foot nerve damage,  tremors in RUE at times    OT Frequency  4x / week    OT Duration  4 weeks    OT Treatment/Interventions  Self-care/ADL training;Therapeutic exercise;Gait Training;Neuromuscular education;Stair Training;Patient/family education;Therapist, nutritional;Therapeutic activities;Balance training;DME and/or AE instruction    Consulted and Agree with Plan of Care  Patient  Patient will benefit from skilled therapeutic intervention in order to improve the following deficits and impairments:  Abnormal gait, Improper body mechanics, Decreased knowledge of use of DME, Decreased strength, Impaired flexibility, Decreased balance, Decreased mobility, Difficulty walking, Decreased range of motion, Decreased coordination, Impaired UE functional use, Decreased endurance, Decreased activity tolerance  Visit Diagnosis: Difficulty in walking, not elsewhere classified  Unsteadiness on feet  Muscle weakness (generalized)  Other lack of coordination    Problem List Patient Active Problem List   Diagnosis Date Noted  . Hyperlipidemia, mixed 05/12/2017  . Allergic rhinitis 05/03/2016  . Herpes simplex 05/03/2016  . 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 OP (osteoporosis) 05/03/2016  . Acute myocardial infarction ([place]) 10/02/2015  . History of cardiac catheterization 10/02/2015  . Incomplete uterine prolapse 12/27/2014  . Chronic constipation 12/26/2014  . H/O malignant neoplasm of colon 10/13/2014  . Parkinsonism ([place]) 05/04/2013  . Parkinson's disease ([place]) 05/04/2013   Felicia Mccaster T Tomasita Snyder, OTR/L, CLT  Felicia Snyder 01/16/2018, 10:21 AM  [place] MAIN [facility] SERVICES [address] [place], [place], [address] Phone: [phone]   Fax:  [phone]  Name: Felicia Snyder MRN: [id number] Date of Birth: 10/04/1946

## 2018-01-17 NOTE — Therapy (Signed)
[place] MAIN [facility] SERVICES [address] [place], [place], [address] Phone: [phone]   Fax:  [phone]  Occupational Therapy Treatment  Patient Details  Name: Felicia Snyder MRN: [id number] Date of Birth: [birth year] Referring Provider: Lezlie Octave   Encounter Date: 01/14/2018  OT End of Session - 01/16/18 1049    Visit Number  5    Number of Visits  17    OT Start Time  0910    OT Stop Time  1005    OT Time Calculation (min)  55 min    Activity Tolerance  Patient tolerated treatment well    Behavior During Therapy  [facility] for tasks assessed/performed       Past Medical History:  Diagnosis Date  . Cancer [facility]) 1996   colon cancer  . Premature osteoporosis     Past Surgical History:  Procedure Laterality Date  . Broken Ankle  2011  . COLECTOMY    . Ruptured Disk  [id number]  . TOOTH EXTRACTION    . TOTAL ABDOMINAL HYSTERECTOMY  2016    There were no vitals filed for this visit.  Subjective Assessment - 01/16/18 1049    Subjective   Patient reports she is continuing to do well with her exercises and can tell a difference in her posture, walking and balance since starting.    Pertinent History  Patient reports she feels like she is leaning forwards more now with her shoulder back and head up, which is affecting her balance. No recent falls but tripped and almost fell.      Patient Stated Goals  Patient would like her posture to be better, move better.    Currently in Pain?  No/denies    Pain Score  0-No pain    Multiple Pain Sites  No                   OT Treatments/Exercises (OP) - 01/17/18 1257      ADLs   ADL Comments  Functional component tasks as follows: 1) sit to stand, 2) reaching to the back of her head to perform hair care, 3) reaching down to shoes for tying, 4) fine motor coordination to thread a needle for sewing, 5) picking up and manipulation of small objects 1/2 inch in size or less, cues  provided for BIG movements and technique. Crossed leg method for reaching to shoes for tying. Postural exercises with wall stretches       Neurological Re-education Exercises   Other Exercises 1  Patient seen for instruction of LSVT BIG exercises: LSVT Daily Session Maximal Daily Exercises: Sustained movements are designed to rescale the amplitude of movement output for generalization to daily functional activities. Performed as follows for 1 set of 10 repetitions each: Multi directional sustained movements- 1) Floor to ceiling, 2) Side to side. Multi directional Repetitive movements performed in standing and are designed to provide retraining effort needed for sustained muscle activation in tasks Performed as follows: 3) Step and reach forward, 4) Step and Reach Backwards, 5) Step and reach sideways, 6) Rock and reach forward/backward, 7) Rock and reach sideways. All exercises in standing performed with SBA and cues as needed along with therapist demonstration. Added complexity to exercises today with use of alternating sides with cues and therapist demo. Sit to stand from mat table on lowest setting with cues for weight shift, technique and SBA for 10 reps for 1 set.   Patient performing reciprocal stepping exercise with  SBA and cues for 10 reps each foot, stair negotiation 4 steps for 5 reps each, cues for big turns, SBA.    Other Exercises 2  Patient seen for functional mobility on even surfaces for one trial of 1000 feet with cues for reciprocal arm swing, cues for posture and neck position while walking, patient tends to lean forwards with head down and eye gaze towards the floor.  She reponds well to cues.              OT Education - 01/16/18 1049    Education provided  Yes    Education Details  HEP    Person(s) Educated  Patient    Methods  Explanation;Demonstration;Verbal cues    Comprehension  Verbal cues required;Returned demonstration;Verbalized understanding          OT Long  Term Goals - 01/16/18 1018      OT LONG TERM GOAL #1   Title  Patient will improve gait speed and endurance and be able to walk 1550 feet in 6 minutes to negotiate around the home and community safely in 4 weeks    Baseline  6 minute walk test at evaluation 1405 feet    Time  4    Period  Weeks    Status  On-going      OT LONG TERM GOAL #2   Title  Patient will complete HEP for maximal daily exercises with modified independence in 4 weeks      Baseline  has not been following program in recent months    Time  4    Period  Weeks    Status  On-going      OT LONG TERM GOAL #3   Title  Patient will transfer from sit to stand without the use of arms safely and independently from a variety of chairs/surfaces in 4 weeks.    Baseline  increased difficulty from lower surfaces    Time  4    Period  Weeks    Status  On-going      OT LONG TERM GOAL #4   Title  Patient will decrease frequency of freezing episodes with score of 7 or less on Freezing of Gait questionnaire.       Baseline  score of 8 on eval     Time  4    Period  Weeks    Status  On-going      OT LONG TERM GOAL #5   Status  On-going            Plan - 01/16/18 1050    Clinical Impression Statement  Patient continues to progress well in all areas, becoming more consistent with exercises, continues to add increased complexity to tasks and added distractions.  Patient able to self correct posture at times and at other times she responds well to cues.  Patient continues to benefit from skilled OT services to maximize safety and independence in daily tasks.     Occupational Profile and client history currently impacting functional performance  Patient with progressive disease process with recent slight decline in functional mobility, has not been consistent recently with daily exercises, near falls, forwards flexed posture, decreased balance.    Occupational performance deficits (Please refer to evaluation for details):   ADL's;IADL's;Leisure;Social Participation    Rehab Potential  Good    Current Impairments/barriers affecting progress:  positive:  motivation, support.  Negative: progressive disease process, frozen shoulder in the past on R, R foot nerve damage, tremors  in RUE at times    OT Frequency  4x / week    OT Duration  4 weeks    OT Treatment/Interventions  Self-care/ADL training;Therapeutic exercise;Gait Training;Neuromuscular education;Stair Training;Patient/family education;Therapist, nutritional;Therapeutic activities;Balance training;DME and/or AE instruction    Consulted and Agree with Plan of Care  Patient       Patient will benefit from skilled therapeutic intervention in order to improve the following deficits and impairments:  Abnormal gait, Improper body mechanics, Decreased knowledge of use of DME, Decreased strength, Impaired flexibility, Decreased balance, Decreased mobility, Difficulty walking, Decreased range of motion, Decreased coordination, Impaired UE functional use, Decreased endurance, Decreased activity tolerance  Visit Diagnosis: Difficulty in walking, not elsewhere classified  Unsteadiness on feet  Muscle weakness (generalized)  Other lack of coordination    Problem List Patient Active Problem List   Diagnosis Date Noted  . Hyperlipidemia, mixed 05/12/2017  . Allergic rhinitis 05/03/2016  . Herpes simplex 05/03/2016  . 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 OP (osteoporosis) 05/03/2016  . Acute myocardial infarction ([place]) 10/02/2015  . History of cardiac catheterization 10/02/2015  . Incomplete uterine prolapse 12/27/2014  . Chronic constipation 12/26/2014  . H/O malignant neoplasm of colon 10/13/2014  . Parkinsonism ([place]) 05/04/2013  . Parkinson's disease ([place]) 05/04/2013   Makari Portman T Tomasita Morrow, OTR/L, CLT  Cythina Mickelsen 01/17/2018, 1:01 PM  [place] MAIN [facility] SERVICES [address] [place], [place],  [address] Phone: [phone]   Fax:  [phone]  Name: Felicia Snyder MRN: [id number] Date of Birth: [birth year]

## 2018-01-19 ENCOUNTER — Ambulatory Visit: Payer: Medicare HMO | Admitting: Occupational Therapy

## 2018-01-19 DIAGNOSIS — R278 Other lack of coordination: Secondary | ICD-10-CM | POA: Diagnosis not present

## 2018-01-19 DIAGNOSIS — M6281 Muscle weakness (generalized): Secondary | ICD-10-CM | POA: Diagnosis not present

## 2018-01-19 DIAGNOSIS — R262 Difficulty in walking, not elsewhere classified: Secondary | ICD-10-CM | POA: Diagnosis not present

## 2018-01-19 DIAGNOSIS — R2681 Unsteadiness on feet: Secondary | ICD-10-CM

## 2018-01-21 ENCOUNTER — Encounter: Payer: Medicare HMO | Admitting: Occupational Therapy

## 2018-01-26 ENCOUNTER — Ambulatory Visit: Payer: Medicare HMO | Admitting: Occupational Therapy

## 2018-01-28 ENCOUNTER — Encounter: Payer: Medicare HMO | Admitting: Occupational Therapy

## 2018-01-31 ENCOUNTER — Encounter: Payer: Self-pay | Admitting: Occupational Therapy

## 2018-01-31 NOTE — Therapy (Signed)
[place] MAIN [facility] SERVICES [address] [place], [place], [address] Phone: [phone]   Fax:  [phone]  Occupational Therapy Treatment/Discharge Summary  Patient Details  Name: Felicia Snyder MRN: [id number] Date of Birth: Mar 22, 1946 Referring Provider: Lezlie Octave   Encounter Date: 01/19/2018  OT End of Session - 01/31/18 1701    Visit Number  6    Number of Visits  17    Date for OT Re-Evaluation  02/06/18    OT Start Time  1430    OT Stop Time  1520    OT Time Calculation (min)  50 min    Activity Tolerance  Patient tolerated treatment well    Behavior During Therapy  [facility] for tasks assessed/performed       Past Medical History:  Diagnosis Date  . Cancer [facility]) 1996   colon cancer  . Premature osteoporosis     Past Surgical History:  Procedure Laterality Date  . Broken Ankle  2011  . COLECTOMY    . Ruptured Disk  [id number]  . TOOTH EXTRACTION    . TOTAL ABDOMINAL HYSTERECTOMY  2016    There were no vitals filed for this visit.  Subjective Assessment - 01/31/18 1657    Subjective   Patient reports this will have to be her last day since she and her husband will be leaving to go to their beach house earlier than anticpated.     Pertinent History  Patient reports she feels like she is leaning forwards more now with her shoulder back and head up, which is affecting her balance. No recent falls but tripped and almost fell.      Patient Stated Goals  Patient would like her posture to be better, move better.    Currently in Pain?  No/denies    Pain Score  0-No pain    Multiple Pain Sites  No                   OT Treatments/Exercises (OP) - 01/31/18 0001      ADLs   ADL Comments  Functional component tasks as follows: 1) sit to stand, 2) reaching to the back of her head to perform hair care, 3) reaching down to shoes for tying, 4) fine motor coordination to thread a needle for sewing, 5) picking up and  manipulation of small objects 1/2 inch in size or less, cues provided for BIG movements and technique. Crossed leg method for reaching to shoes for tying. Postural exercises with wall stretches.  Completed with modified independence.      Neurological Re-education Exercises   Other Exercises 1  Patient seen for instruction of LSVT BIG exercises: LSVT Daily Session Maximal Daily Exercises: Sustained movements are designed to rescale the amplitude of movement output for generalization to daily functional activities. Performed as follows for 1 set of 10 repetitions each: Multi directional sustained movements- 1) Floor to ceiling, 2) Side to side. Multi directional Repetitive movements performed in standing and are designed to provide retraining effort needed for sustained muscle activation in tasks Performed as follows: 3) Step and reach forward, 4) Step and Reach Backwards, 5) Step and reach sideways, 6) Rock and reach forward/backward, 7) Rock and reach sideways. All exercises in standing performed with modified independence     Other Exercises 2  6 minute walk test 1530 feet, 5 times sit to stand 10 secs.              OT  Education - 01/31/18 1701    Education provided  Yes    Education Details  HEP for LSVT BIG and how to increase complexity of exercises over time    Person(s) Educated  Patient    Methods  Explanation;Demonstration    Comprehension  Verbalized understanding;Returned demonstration          OT Long Term Goals - 01/31/18 1703      OT LONG TERM GOAL #1   Title  Patient will improve gait speed and endurance and be able to walk 1550 feet in 6 minutes to negotiate around the home and community safely in 4 weeks    Baseline  6 minute walk test at evaluation 1405 feet    Time  4    Period  Weeks    Status  Achieved      OT LONG TERM GOAL #2   Title  Patient will complete HEP for maximal daily exercises with modified independence in 4 weeks      Baseline  has not been  following program in recent months    Time  4    Period  Weeks    Status  Achieved      OT LONG TERM GOAL #3   Title  Patient will transfer from sit to stand without the use of arms safely and independently from a variety of chairs/surfaces in 4 weeks.    Baseline  increased difficulty from lower surfaces    Time  4    Period  Weeks    Status  Achieved      OT LONG TERM GOAL #4   Title  Patient will decrease frequency of freezing episodes with score of 7 or less on Freezing of Gait questionnaire.       Baseline  score of 8 on eval     Time  4    Period  Weeks    Status  Achieved      OT LONG TERM GOAL #5   Status  On-going            Plan - 01/31/18 1702    Clinical Impression Statement  Patient has made good progress over the last couple weeks and has to stop therapy early due to departing to the beach earlier than she expected in which she will stay for a few months. She made excellent progress with improving her amplitude of steps during gait patterns, increase of reciprocal arm swing and balance. She was able to increase in complexity of exercises and was issued information on how to continue to increase complexity as time passes. Recommend she continue with her maximal daily exercises for two times a day for a couple more weeks and then transition to one time a day. Patient has met and exceeded goals and is ready for discharge at this time.     Occupational Profile and client history currently impacting functional performance  Patient with progressive disease process with recent slight decline in functional mobility, has not been consistent recently with daily exercises, near falls, forwards flexed posture, decreased balance.    Occupational performance deficits (Please refer to evaluation for details):  ADL's;IADL's;Leisure;Social Participation    Rehab Potential  Good       Patient will benefit from skilled therapeutic intervention in order to improve the following  deficits and impairments:  Abnormal gait, Improper body mechanics, Decreased knowledge of use of DME, Decreased strength, Impaired flexibility, Decreased balance, Decreased mobility, Difficulty walking, Decreased range of motion, Decreased coordination,  Impaired UE functional use, Decreased endurance, Decreased activity tolerance  Visit Diagnosis: Difficulty in walking, not elsewhere classified  Unsteadiness on feet  Muscle weakness (generalized)  Other lack of coordination    Problem List Patient Active Problem List   Diagnosis Date Noted  . Hyperlipidemia, mixed 05/12/2017  . Allergic rhinitis 05/03/2016  . Herpes simplex 05/03/2016  . 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 OP (osteoporosis) 05/03/2016  . Acute myocardial infarction ([place]) 10/02/2015  . History of cardiac catheterization 10/02/2015  . Incomplete uterine prolapse 12/27/2014  . Chronic constipation 12/26/2014  . H/O malignant neoplasm of colon 10/13/2014  . Parkinsonism ([place]) 05/04/2013  . Parkinson's disease ([place]) 05/04/2013   Heyward Douthit T Tomasita Morrow, OTR/L, CLT  Aubriella Perezgarcia 01/31/2018, 5:04 PM  [place] MAIN [facility] SERVICES [address] [place], [place], [address] Phone: [phone]   Fax:  [phone]  Name: Sherell Christoffel MRN: [id number] Date of Birth: 1946-02-24

## 2018-02-02 ENCOUNTER — Ambulatory Visit: Payer: Medicare HMO | Admitting: Occupational Therapy

## 2018-02-04 ENCOUNTER — Encounter: Payer: Medicare HMO | Admitting: Occupational Therapy

## 2018-02-12 DIAGNOSIS — K219 Gastro-esophageal reflux disease without esophagitis: Secondary | ICD-10-CM | POA: Diagnosis not present

## 2018-02-12 DIAGNOSIS — Z6827 Body mass index (BMI) 27.0-27.9, adult: Secondary | ICD-10-CM | POA: Diagnosis not present

## 2018-02-23 DIAGNOSIS — Z85820 Personal history of malignant melanoma of skin: Secondary | ICD-10-CM | POA: Diagnosis not present

## 2018-02-23 DIAGNOSIS — R05 Cough: Secondary | ICD-10-CM | POA: Diagnosis not present

## 2018-02-23 DIAGNOSIS — I252 Old myocardial infarction: Secondary | ICD-10-CM | POA: Diagnosis not present

## 2018-02-23 DIAGNOSIS — Z7982 Long term (current) use of aspirin: Secondary | ICD-10-CM | POA: Diagnosis not present

## 2018-02-23 DIAGNOSIS — Z87891 Personal history of nicotine dependence: Secondary | ICD-10-CM | POA: Diagnosis not present

## 2018-02-23 DIAGNOSIS — Z85038 Personal history of other malignant neoplasm of large intestine: Secondary | ICD-10-CM | POA: Diagnosis not present

## 2018-02-23 DIAGNOSIS — K2950 Unspecified chronic gastritis without bleeding: Secondary | ICD-10-CM | POA: Diagnosis not present

## 2018-02-23 DIAGNOSIS — Z79899 Other long term (current) drug therapy: Secondary | ICD-10-CM | POA: Diagnosis not present

## 2018-02-23 DIAGNOSIS — K219 Gastro-esophageal reflux disease without esophagitis: Secondary | ICD-10-CM | POA: Diagnosis not present

## 2018-03-03 DIAGNOSIS — K2970 Gastritis, unspecified, without bleeding: Secondary | ICD-10-CM | POA: Diagnosis not present

## 2018-03-03 DIAGNOSIS — Z1331 Encounter for screening for depression: Secondary | ICD-10-CM | POA: Diagnosis not present

## 2018-03-03 DIAGNOSIS — Z6827 Body mass index (BMI) 27.0-27.9, adult: Secondary | ICD-10-CM | POA: Diagnosis not present

## 2018-03-03 DIAGNOSIS — K209 Esophagitis, unspecified: Secondary | ICD-10-CM | POA: Diagnosis not present

## 2018-03-24 DIAGNOSIS — K219 Gastro-esophageal reflux disease without esophagitis: Secondary | ICD-10-CM | POA: Diagnosis not present

## 2018-03-24 DIAGNOSIS — Z1331 Encounter for screening for depression: Secondary | ICD-10-CM | POA: Diagnosis not present

## 2018-03-24 DIAGNOSIS — Z6827 Body mass index (BMI) 27.0-27.9, adult: Secondary | ICD-10-CM | POA: Diagnosis not present

## 2018-04-23 DIAGNOSIS — Z9889 Other specified postprocedural states: Secondary | ICD-10-CM | POA: Diagnosis not present

## 2018-04-23 DIAGNOSIS — I214 Non-ST elevation (NSTEMI) myocardial infarction: Secondary | ICD-10-CM | POA: Diagnosis not present

## 2018-04-23 DIAGNOSIS — E785 Hyperlipidemia, unspecified: Secondary | ICD-10-CM | POA: Diagnosis not present

## 2018-04-28 DIAGNOSIS — H35373 Puckering of macula, bilateral: Secondary | ICD-10-CM | POA: Diagnosis not present

## 2018-04-28 DIAGNOSIS — H25013 Cortical age-related cataract, bilateral: Secondary | ICD-10-CM | POA: Diagnosis not present

## 2018-04-28 DIAGNOSIS — H35363 Drusen (degenerative) of macula, bilateral: Secondary | ICD-10-CM | POA: Diagnosis not present

## 2018-04-28 DIAGNOSIS — H2513 Age-related nuclear cataract, bilateral: Secondary | ICD-10-CM | POA: Diagnosis not present

## 2018-05-18 DIAGNOSIS — H25013 Cortical age-related cataract, bilateral: Secondary | ICD-10-CM | POA: Diagnosis not present

## 2018-05-18 DIAGNOSIS — H35373 Puckering of macula, bilateral: Secondary | ICD-10-CM | POA: Diagnosis not present

## 2018-05-18 DIAGNOSIS — H2513 Age-related nuclear cataract, bilateral: Secondary | ICD-10-CM | POA: Diagnosis not present

## 2018-05-18 DIAGNOSIS — H35363 Drusen (degenerative) of macula, bilateral: Secondary | ICD-10-CM | POA: Diagnosis not present

## 2018-05-18 DIAGNOSIS — H2511 Age-related nuclear cataract, right eye: Secondary | ICD-10-CM | POA: Diagnosis not present

## 2018-05-26 DIAGNOSIS — G20 Parkinson's disease: Secondary | ICD-10-CM | POA: Diagnosis not present

## 2018-06-03 DIAGNOSIS — H2511 Age-related nuclear cataract, right eye: Secondary | ICD-10-CM | POA: Diagnosis not present

## 2018-06-03 DIAGNOSIS — H25811 Combined forms of age-related cataract, right eye: Secondary | ICD-10-CM | POA: Diagnosis not present

## 2018-06-08 DIAGNOSIS — H25012 Cortical age-related cataract, left eye: Secondary | ICD-10-CM | POA: Diagnosis not present

## 2018-06-08 DIAGNOSIS — H2512 Age-related nuclear cataract, left eye: Secondary | ICD-10-CM | POA: Diagnosis not present

## 2018-06-15 IMAGING — MG MM DIGITAL SCREENING BILAT W/ CAD
4 series · 4 of 4 positions shown · non-contrast
Comparison: Previous exam(s).

CLINICAL DATA: Screening.

EXAM:
DIGITAL SCREENING BILATERAL MAMMOGRAM WITH CAD

[L CC]
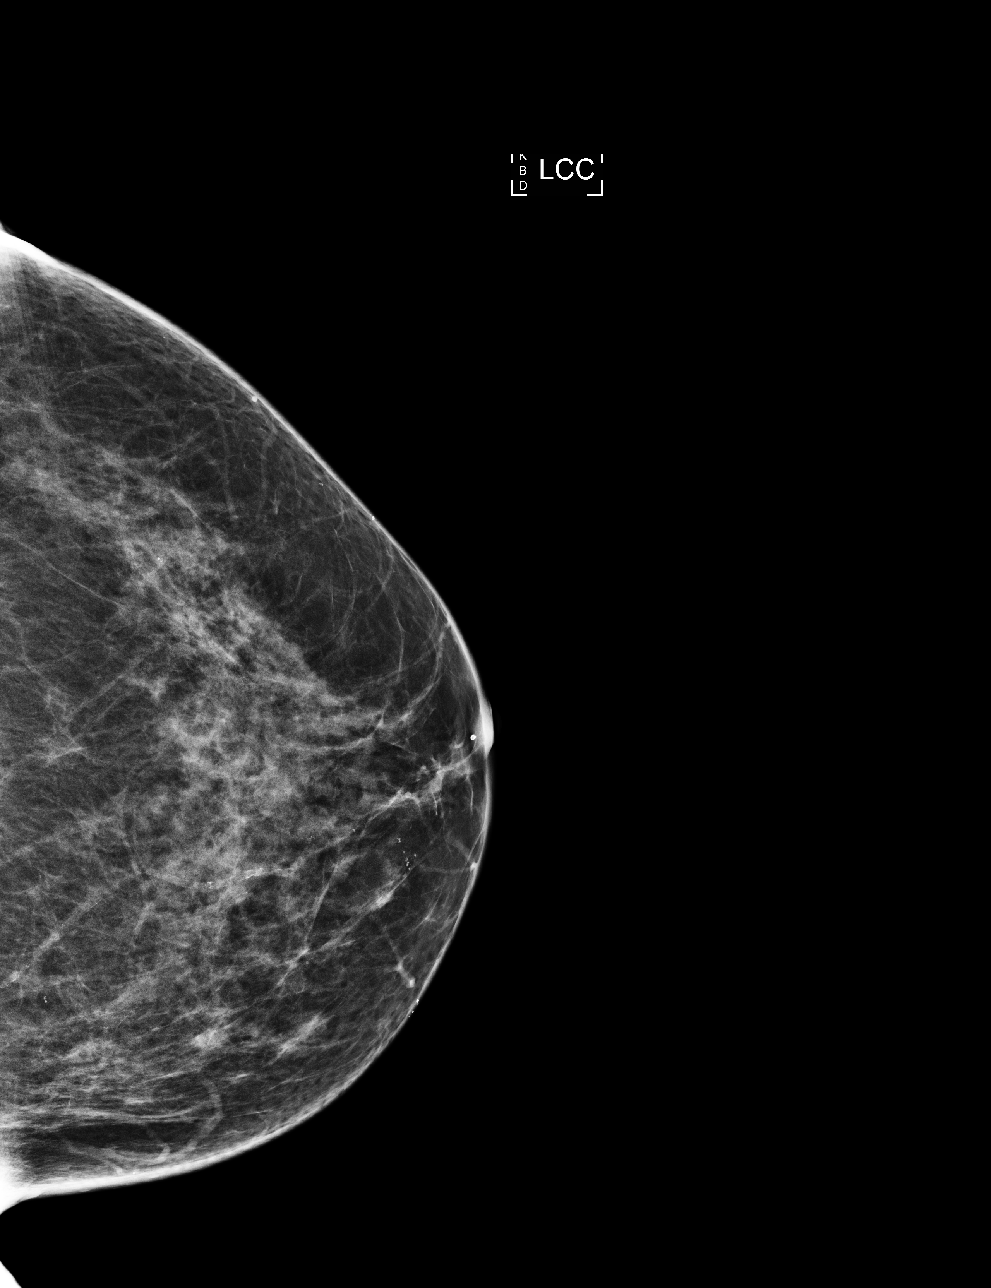

[R CC]
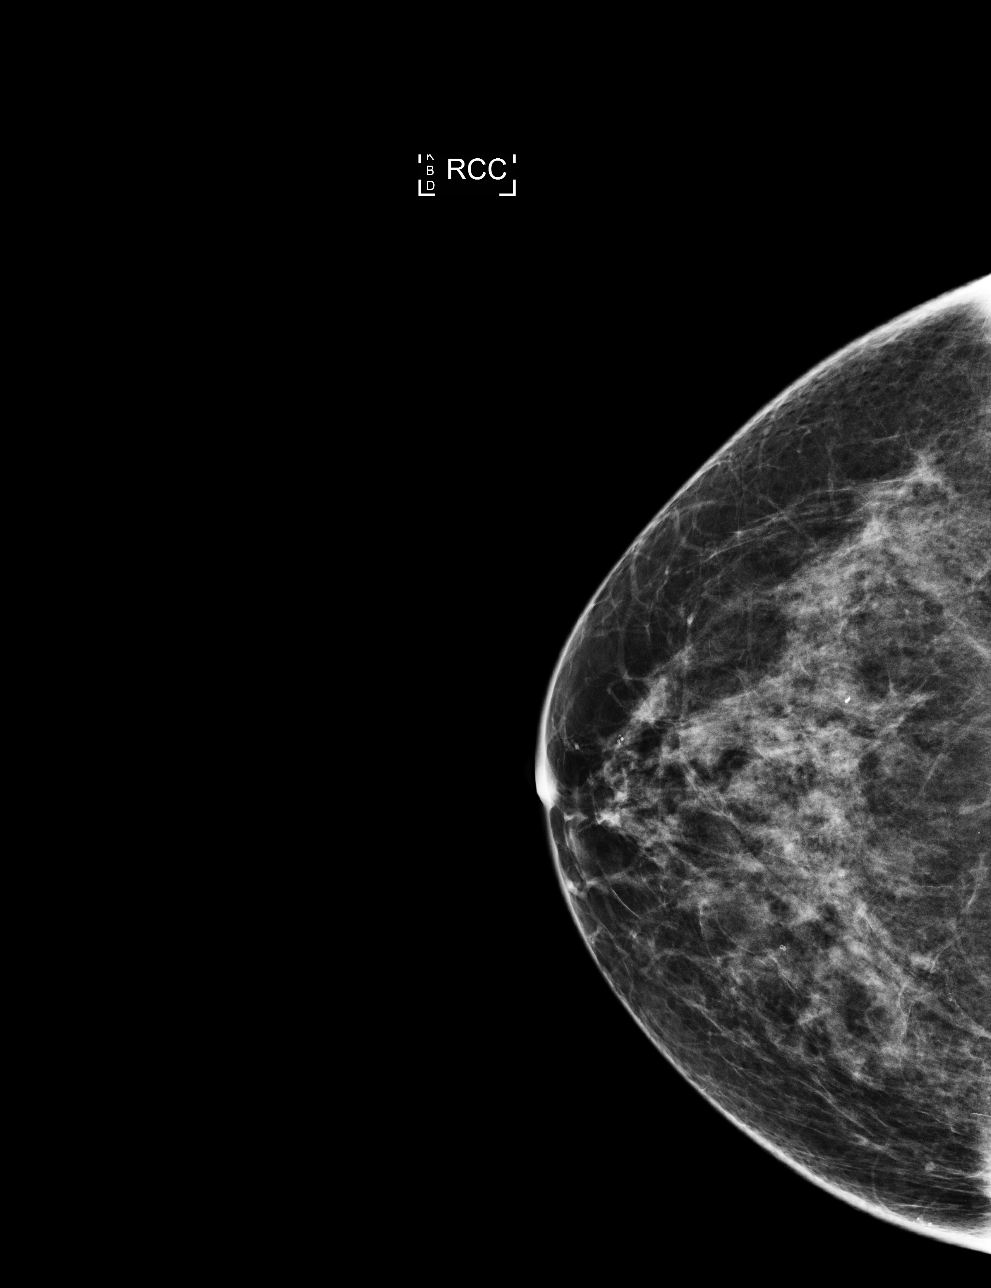

[R MLO]
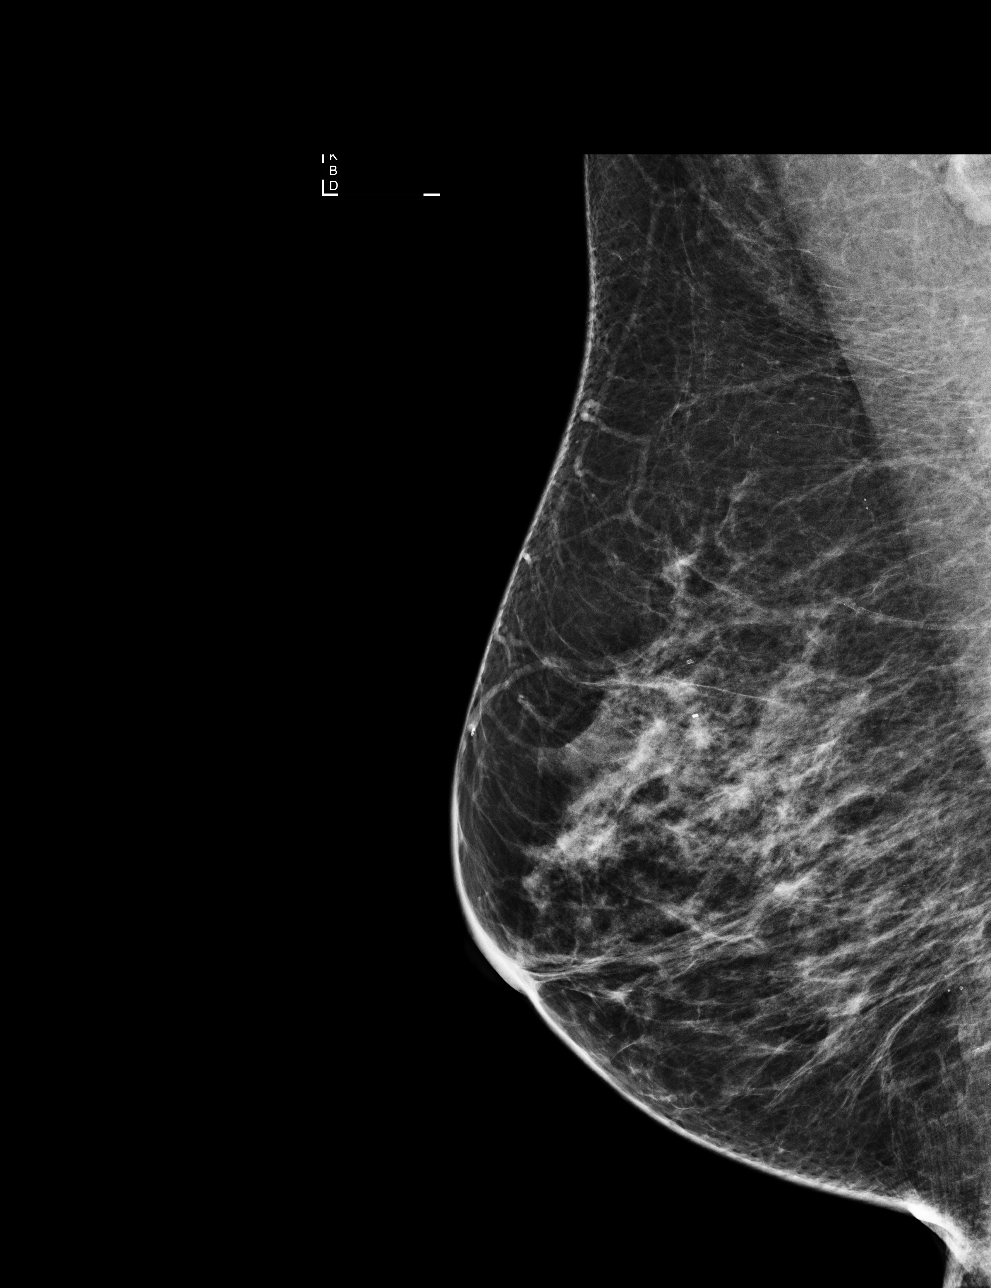

[L MLO]
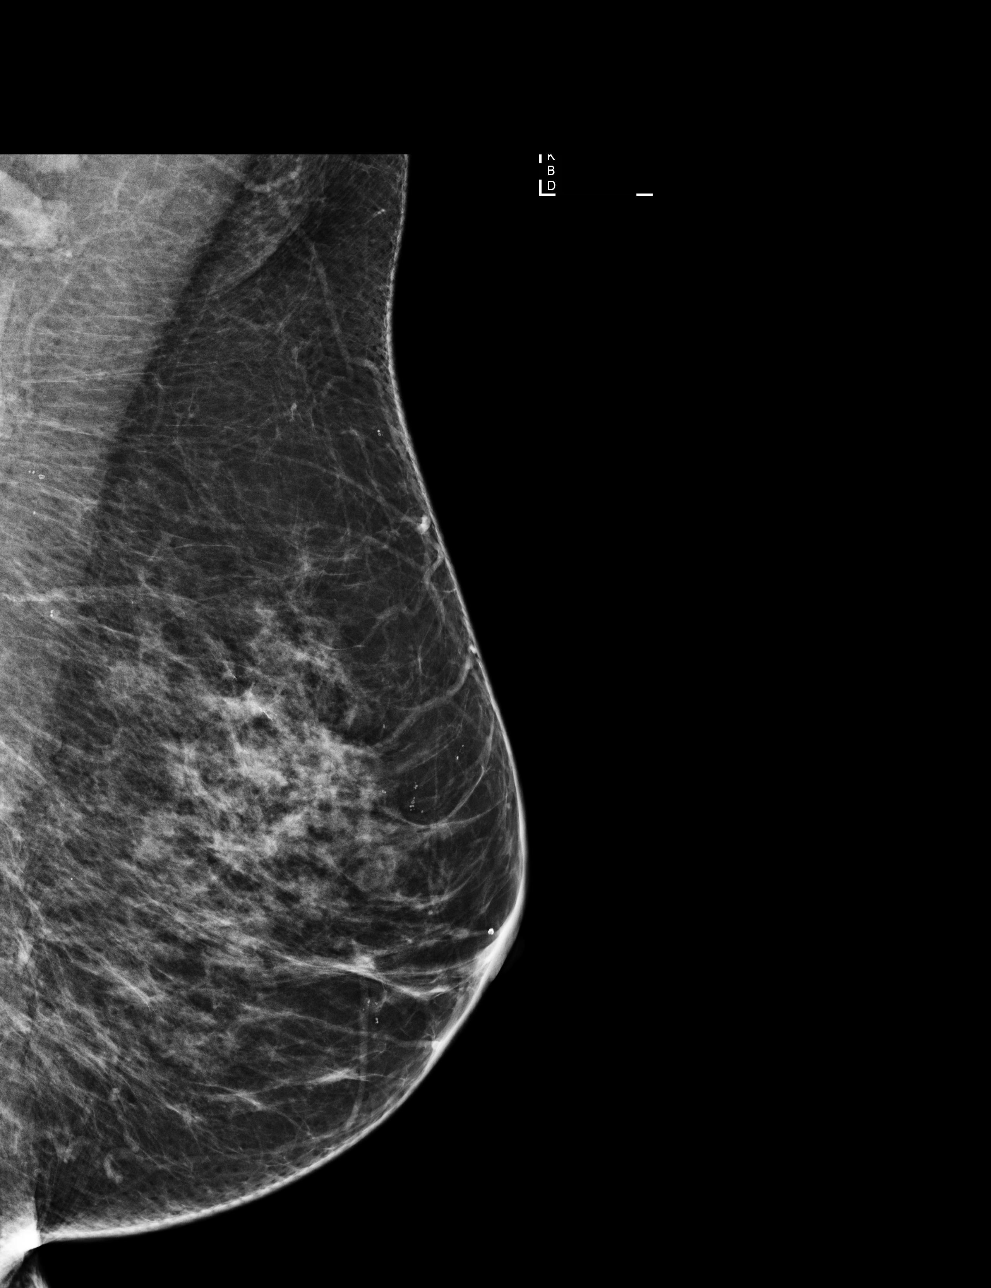

[4 of 4 positions shown; findings below may reference images not displayed]

ACR Breast Density Category c: The breast tissue is heterogeneously
dense, which may obscure small masses.
FINDINGS: There are no findings suspicious for malignancy. Images were
processed with CAD.
IMPRESSION: No mammographic evidence of malignancy. A result letter of this
screening mammogram will be mailed directly to the patient.

RECOMMENDATION:
Screening mammogram in one year. (Code:YJ-2-FEZ)

BI-RADS CATEGORY  1: Negative.

## 2018-06-17 DIAGNOSIS — H2512 Age-related nuclear cataract, left eye: Secondary | ICD-10-CM | POA: Diagnosis not present

## 2018-06-17 DIAGNOSIS — H25812 Combined forms of age-related cataract, left eye: Secondary | ICD-10-CM | POA: Diagnosis not present

## 2018-07-08 DIAGNOSIS — M722 Plantar fascial fibromatosis: Secondary | ICD-10-CM | POA: Diagnosis not present

## 2018-07-08 DIAGNOSIS — M7731 Calcaneal spur, right foot: Secondary | ICD-10-CM | POA: Diagnosis not present

## 2018-07-10 ENCOUNTER — Ambulatory Visit: Payer: No Typology Code available for payment source | Admitting: Podiatry

## 2018-07-13 DIAGNOSIS — M722 Plantar fascial fibromatosis: Secondary | ICD-10-CM | POA: Diagnosis not present

## 2018-09-23 DIAGNOSIS — Z08 Encounter for follow-up examination after completed treatment for malignant neoplasm: Secondary | ICD-10-CM | POA: Diagnosis not present

## 2018-09-23 DIAGNOSIS — Z1283 Encounter for screening for malignant neoplasm of skin: Secondary | ICD-10-CM | POA: Diagnosis not present

## 2018-09-23 DIAGNOSIS — Z85820 Personal history of malignant melanoma of skin: Secondary | ICD-10-CM | POA: Diagnosis not present

## 2018-09-23 DIAGNOSIS — L821 Other seborrheic keratosis: Secondary | ICD-10-CM | POA: Diagnosis not present

## 2018-09-23 DIAGNOSIS — L218 Other seborrheic dermatitis: Secondary | ICD-10-CM | POA: Diagnosis not present

## 2018-09-28 DIAGNOSIS — N812 Incomplete uterovaginal prolapse: Secondary | ICD-10-CM | POA: Diagnosis not present

## 2018-09-28 DIAGNOSIS — N3941 Urge incontinence: Secondary | ICD-10-CM | POA: Diagnosis not present

## 2018-09-28 DIAGNOSIS — N8110 Cystocele, unspecified: Secondary | ICD-10-CM | POA: Diagnosis not present

## 2018-10-07 DIAGNOSIS — R69 Illness, unspecified: Secondary | ICD-10-CM | POA: Diagnosis not present

## 2018-10-19 DIAGNOSIS — E785 Hyperlipidemia, unspecified: Secondary | ICD-10-CM | POA: Diagnosis not present

## 2018-10-19 DIAGNOSIS — Z01810 Encounter for preprocedural cardiovascular examination: Secondary | ICD-10-CM | POA: Diagnosis not present

## 2018-10-19 DIAGNOSIS — Z9889 Other specified postprocedural states: Secondary | ICD-10-CM | POA: Diagnosis not present

## 2018-10-19 DIAGNOSIS — I214 Non-ST elevation (NSTEMI) myocardial infarction: Secondary | ICD-10-CM | POA: Diagnosis not present

## 2018-10-21 DIAGNOSIS — Z961 Presence of intraocular lens: Secondary | ICD-10-CM | POA: Diagnosis not present

## 2018-10-21 DIAGNOSIS — H35363 Drusen (degenerative) of macula, bilateral: Secondary | ICD-10-CM | POA: Diagnosis not present

## 2018-10-21 DIAGNOSIS — H35373 Puckering of macula, bilateral: Secondary | ICD-10-CM | POA: Diagnosis not present

## 2018-10-21 DIAGNOSIS — H5319 Other subjective visual disturbances: Secondary | ICD-10-CM | POA: Diagnosis not present

## 2018-11-19 LAB — CBC AND DIFFERENTIAL
HCT: 38 (ref 36–46)
Hemoglobin: 12.5 (ref 12.0–16.0)
Platelets: 232 (ref 150–399)
WBC: 5

## 2018-11-19 LAB — BASIC METABOLIC PANEL
BUN: 16 (ref 4–21)
Creatinine: 0.6 (ref 0.5–1.1)
Glucose: 93
POTASSIUM: 4.2 (ref 3.4–5.3)
Sodium: 137 (ref 137–147)

## 2018-11-26 LAB — LIPID PANEL
Cholesterol: 135 (ref 0–200)
HDL: 64 (ref 35–70)
LDL Cholesterol: 58
Triglycerides: 64 (ref 40–160)

## 2018-11-26 LAB — HEPATIC FUNCTION PANEL
ALT: 23 (ref 7–35)
AST: 24 (ref 13–35)

## 2018-12-21 ENCOUNTER — Ambulatory Visit (INDEPENDENT_AMBULATORY_CARE_PROVIDER_SITE_OTHER): Payer: Medicare HMO | Admitting: Family Medicine

## 2018-12-21 ENCOUNTER — Encounter: Payer: Self-pay | Admitting: Family Medicine

## 2018-12-21 VITALS — BP 122/72 | HR 60 | Temp 98.7°F | Resp 16 | Ht 64.0 in | Wt 168.6 lb

## 2018-12-21 DIAGNOSIS — Z1239 Encounter for other screening for malignant neoplasm of breast: Secondary | ICD-10-CM

## 2018-12-21 DIAGNOSIS — S39012A Strain of muscle, fascia and tendon of lower back, initial encounter: Secondary | ICD-10-CM | POA: Diagnosis not present

## 2018-12-21 MED ORDER — CYCLOBENZAPRINE HCL 5 MG PO TABS: ORAL_TABLET | ORAL | 0 refills | Status: DC

## 2018-12-21 NOTE — Patient Instructions (Addendum)
Discussed use of warm compresses for 20 minutes several x day. May schedule Tylenol up to 3000 mg/day. Let us know if not improving. The two providers accepting new patients are Carles Collet PA and Lavon Paganini M.D. Call [facility] and make an appointment. I have sent in the mammogram order.

## 2018-12-21 NOTE — Progress Notes (Signed)
  Subjective:     Patient ID: Felicia Snyder, female   DOB: [birth year], 73 y.o.   MRN: [id number] Chief Complaint  Patient presents with  . Back Pain    X 4 days. She denies any injuries. She reports that the pain is mainly on her right side. Pain is worse when twisting from side to side. Patient has taken ibuprofen and tylenol with no relief.    HPI States she has taken ibuprofen, Tylenol and left over pain medication for her sx. Denies radiation of pain.Patient states she is no longer on Plavix. Wishes mammogram order.  Review of Systems     Objective:   Physical Exam Constitutional:      General: She is not in acute distress (mild increased pain when changing positions.). Musculoskeletal:     Comments: Muscle strength in lower extremities 5/5. SLR's to 90 degrees without radiation of back pain. Localizes to her right lumbar paravertebral area.  Neurological:     Mental Status: She is alert.        Assessment:    1. Strain of lumbar region, initial encounter - cyclobenzaprine (FLEXERIL) 5 MG tablet; Take one at bedtime as needed for back spasms  Dispense: 14 tablet; Refill: 0  2. Screening for breast cancer - MM 3D SCREEN BREAST BILATERAL; Future    Plan:    Discussed use of Tylenol to 3000 mg/day and warm compresses.

## 2019-01-12 ENCOUNTER — Ambulatory Visit
Admission: RE | Admit: 2019-01-12 | Discharge: 2019-01-12 | Disposition: A | Discharge status: Home / Self Care | Payer: Medicare HMO | Source: Ambulatory Visit | Attending: Family Medicine | Admitting: Family Medicine

## 2019-01-12 DIAGNOSIS — Z1231 Encounter for screening mammogram for malignant neoplasm of breast: Secondary | ICD-10-CM | POA: Diagnosis not present

## 2019-01-12 DIAGNOSIS — Z1239 Encounter for other screening for malignant neoplasm of breast: Secondary | ICD-10-CM

## 2019-01-13 ENCOUNTER — Ambulatory Visit (INDEPENDENT_AMBULATORY_CARE_PROVIDER_SITE_OTHER): Payer: Medicare HMO | Admitting: Family Medicine

## 2019-01-13 ENCOUNTER — Telehealth: Payer: Self-pay

## 2019-01-13 ENCOUNTER — Other Ambulatory Visit: Payer: Self-pay

## 2019-01-13 ENCOUNTER — Encounter: Payer: Self-pay | Admitting: Family Medicine

## 2019-01-13 VITALS — BP 132/69 | HR 69 | Temp 98.0°F | Ht 64.0 in | Wt 167.8 lb

## 2019-01-13 DIAGNOSIS — B009 Herpesviral infection, unspecified: Secondary | ICD-10-CM

## 2019-01-13 DIAGNOSIS — E782 Mixed hyperlipidemia: Secondary | ICD-10-CM

## 2019-01-13 DIAGNOSIS — Z85820 Personal history of malignant melanoma of skin: Secondary | ICD-10-CM

## 2019-01-13 DIAGNOSIS — I2510 Atherosclerotic heart disease of native coronary artery without angina pectoris: Secondary | ICD-10-CM

## 2019-01-13 DIAGNOSIS — G20 Parkinson's disease: Secondary | ICD-10-CM

## 2019-01-13 DIAGNOSIS — N812 Incomplete uterovaginal prolapse: Secondary | ICD-10-CM

## 2019-01-13 DIAGNOSIS — Z85038 Personal history of other malignant neoplasm of large intestine: Secondary | ICD-10-CM

## 2019-01-13 DIAGNOSIS — I252 Old myocardial infarction: Secondary | ICD-10-CM

## 2019-01-13 DIAGNOSIS — J301 Allergic rhinitis due to pollen: Secondary | ICD-10-CM

## 2019-01-13 NOTE — Progress Notes (Signed)
Patient: Felicia Snyder Female    DOB: Dec 07, 1945   73 y.o.   MRN: [id number] Visit Date: 01/13/2019  Today's Provider: Lavon Paganini, MD   Chief Complaint  Patient presents with  . Follow-up   Subjective:    I, Tiburcio Pea, CMA, am acting as a scribe for Lavon Paganini, MD.   HPI  Patient is here for follow up and establish care with Dr. Brita Romp since [facility] left. Patient is doing well on current medication regimen. Patient is being followed by a Neurologist Lezlie Octave in [place]), Cardiologist (Clabe Seal PA at [facility]), and [place].   Patient has a history of MI.  She is taking aspirin, statin.  She is not on a beta-blocker.  She is followed by cardiology for this.  She has nitroglycerin, but denies any recent chest pain  Patient has history of malignant melanoma of her back.  She is followed annually by dermatology for skin checks and by surgical oncology who removed the lesion.  She denies any recent new lesions.  Patient has a history of colon cancer 1995.  She underwent surgery and chemotherapy.  She is followed by [facility] clinic gastroenterology.  She is due for colonoscopy next year.  Patient is followed by neurology for Parkinson's disease.  She is taking Sinemet and Mirapex.  Patient has a history of genital herpes.  She takes acyclovir as needed for flares.  She has had none recently.  She also sees Dr. Vickki Muff for podiatry.  Patient is followed by uro-Gyn for uterine prolapse.    No Known Allergies   Current Outpatient Medications:  .  acyclovir (ZOVIRAX) 200 MG capsule, Take 1 capsule (200 mg total) by mouth 5 (five) times daily. For five days per flare., Disp: 25 capsule, Rfl: 5 .  aspirin EC 81 MG tablet, Take 81 mg by mouth., Disp: , Rfl:  .  atorvastatin (LIPITOR) 80 MG tablet, Take 80 mg by mouth daily., Disp: , Rfl:  .  carbidopa-levodopa (SINEMET IR) 25-100 MG tablet, Take 1 tablet by mouth 3 (three) times daily., Disp: , Rfl:  .   cyanocobalamin 1000 MCG tablet, Take 1,000 mcg by mouth daily., Disp: , Rfl:  .  fish oil-omega-3 fatty acids 1000 MG capsule, Take 1,000 mg by mouth daily., Disp: , Rfl:  .  loratadine (CLARITIN) 10 MG tablet, Take 10 mg by mouth as needed for allergies., Disp: , Rfl:  .  nitroGLYCERIN (NITROSTAT) 0.4 MG SL tablet, TAKE 1 TABLET UNDER TONGUE EVERY 5 MINUTES AS NEEDED FOR CHEST PAIN, Disp: , Rfl:  .  pramipexole (MIRAPEX) 1 MG tablet, Take 4.5 mg by mouth 2 (two) times daily. , Disp: , Rfl:  .  vitamin C (ASCORBIC ACID) 500 MG tablet, Take 500 mg by mouth., Disp: , Rfl:  .  vitamin E 400 UNIT capsule, Take 400 Units by mouth daily., Disp: , Rfl:  .  cyclobenzaprine (FLEXERIL) 5 MG tablet, Take one at bedtime as needed for back spasms (Patient not taking: Reported on 01/13/2019), Disp: 14 tablet, Rfl: 0  Review of Systems  Constitutional: Negative.   Respiratory: Negative.   Cardiovascular: Negative.   Musculoskeletal: Negative.     Social History   Tobacco Use  . Smoking status: Former Smoker    Types: Cigarettes  . Smokeless tobacco: Never Used  Substance Use Topics  . Alcohol use: Yes    Alcohol/week: 7.0 standard drinks    Types: 7 Glasses of wine per week  Objective:   BP 132/69 (BP Location: Right Arm, Patient Position: Sitting, Cuff Size: Normal)   Pulse 69   Temp 98 F (36.7 C) (Oral)   Ht 5\' 4"  (1.626 m)   Wt 167 lb 12.8 oz (76.1 kg)   SpO2 96%   BMI 28.80 kg/m  Vitals:   01/13/19 1402  BP: 132/69  Pulse: 69  Temp: 98 F (36.7 C)  TempSrc: Oral  SpO2: 96%  Weight: 167 lb 12.8 oz (76.1 kg)  Height: 5\' 4"  (1.626 m)     Physical Exam Vitals signs reviewed.  Constitutional:      General: She is not in acute distress.    Appearance: Normal appearance. She is well-developed. She is not diaphoretic.  HENT:     Head: Normocephalic and atraumatic.  Eyes:     General: No scleral icterus.    Conjunctiva/sclera: Conjunctivae normal.  Neck:      Musculoskeletal: Neck supple.     Thyroid: No thyromegaly.  Cardiovascular:     Rate and Rhythm: Normal rate and regular rhythm.     Pulses: Normal pulses.     Heart sounds: Normal heart sounds. No murmur.  Pulmonary:     Effort: Pulmonary effort is normal. No respiratory distress.     Breath sounds: Normal breath sounds. No wheezing, rhonchi or rales.  Musculoskeletal:     Right lower leg: No edema.     Left lower leg: No edema.  Lymphadenopathy:     Cervical: No cervical adenopathy.  Skin:    General: Skin is warm and dry.     Capillary Refill: Capillary refill takes less than 2 seconds.     Findings: No rash.  Neurological:     Mental Status: She is alert and oriented to person, place, and time. Mental status is at baseline.  Psychiatric:        Mood and Affect: Mood normal.        Behavior: Behavior normal.         Assessment & Plan   Problem List Items Addressed This Visit      Cardiovascular and Mediastinum   CAD (coronary artery disease)    History of MI Continue aspirin and statin Followed by cardiology No chest pain        Respiratory   Allergic rhinitis    Chronic and stable Continue OTC antihistamine as needed        Nervous and Auditory   Parkinson's disease ([place]) - Primary    Chronic and stable Followed by neurology Continue current medications        Genitourinary   Incomplete uterine prolapse    Followed by urogynecology Stable        Other   History of MI (myocardial infarction)    Followed by cardiology Continue aspirin and statin Not on beta-blocker Reviewed recent labs by cardiology      H/O malignant neoplasm of colon    Status post surgery and chemotherapy Followed with regular colonoscopies by [facility] clinic GI Next colonoscopy in 2021      Herpes simplex    Intermittent flares None recently Continue acyclovir as needed      History of malignant melanoma    Followed regularly by surgical oncology and  dermatology regularly to monitor for any recurrence No worrisome skin findings today      Hyperlipidemia, mixed    Reviewed recent lipid panel with LDL at goal Continue statin at current dose  Return in about 6 months (around 07/16/2019) for CPE/AWV.   The entirety of the information documented in the History of Present Illness, Review of Systems and Physical Exam were personally obtained by me. Portions of this information were initially documented by Tiburcio Pea, CMA and reviewed by me for thoroughness and accuracy.    Virginia Crews, MD, MPH [facility] 01/15/2019 8:49 AM

## 2019-01-13 NOTE — Telephone Encounter (Signed)
-----   Message from Virginia Crews, MD sent at 01/13/2019  9:54 AM EDT ----- Normal mammogram. Repeat in 1 yr  Also see if she has established new PCP in our office

## 2019-01-13 NOTE — Telephone Encounter (Signed)
Patient was advised and schedule appointment for 01/14/2019 @ 9:40 AM to with you for a meet and greet.

## 2019-01-14 ENCOUNTER — Ambulatory Visit: Payer: Self-pay | Admitting: Family Medicine

## 2019-01-15 DIAGNOSIS — I2510 Atherosclerotic heart disease of native coronary artery without angina pectoris: Secondary | ICD-10-CM | POA: Insufficient documentation

## 2019-01-15 NOTE — Assessment & Plan Note (Signed)
Reviewed recent lipid panel with LDL at goal Continue statin at current dose

## 2019-01-15 NOTE — Assessment & Plan Note (Signed)
History of MI Continue aspirin and statin Followed by cardiology No chest pain

## 2019-01-15 NOTE — Assessment & Plan Note (Signed)
Followed by cardiology Continue aspirin and statin Not on beta-blocker Reviewed recent labs by cardiology

## 2019-01-15 NOTE — Assessment & Plan Note (Signed)
Intermittent flares None recently Continue acyclovir as needed

## 2019-01-15 NOTE — Assessment & Plan Note (Signed)
Chronic and stable Followed by neurology Continue current medications

## 2019-01-15 NOTE — Assessment & Plan Note (Signed)
Followed by urogynecology Stable

## 2019-01-15 NOTE — Assessment & Plan Note (Signed)
Status post surgery and chemotherapy Followed with regular colonoscopies by [facility] clinic GI Next colonoscopy in 2021

## 2019-01-15 NOTE — Assessment & Plan Note (Signed)
Chronic and stable Continue OTC antihistamine as needed

## 2019-01-15 NOTE — Assessment & Plan Note (Signed)
Followed regularly by surgical oncology and dermatology regularly to monitor for any recurrence No worrisome skin findings today

## 2019-07-02 ENCOUNTER — Ambulatory Visit: Payer: Medicare HMO

## 2019-07-02 ENCOUNTER — Ambulatory Visit: Payer: Medicare HMO | Admitting: Family Medicine

## 2019-10-05 NOTE — Progress Notes (Deleted)
Subjective:   Felicia Snyder is a 73 y.o. female who presents for Medicare Annual (Subsequent) preventive examination.  Review of Systems:  N/A        Objective:     Vitals: There were no vitals taken for this visit.  There is no height or weight on file to calculate BMI.  Advanced Directives 01/07/2018 04/15/2016  Does Patient Have a Medical Advance Directive? Yes Yes  Type of Paramedic of [place];Living will Portal;Living will  Copy of [place] in Chart? No - copy requested No - copy requested    Tobacco Social History   Tobacco Use  Smoking Status Former Smoker  . Types: Cigarettes  Smokeless Tobacco Never Used     Counseling given: Not Answered   Clinical Intake:                       Past Medical History:  Diagnosis Date  . Cancer [facility]) 1996   colon cancer  . Cataract   . Premature osteoporosis    Past Surgical History:  Procedure Laterality Date  . BLADDER SURGERY    . Broken Ankle  2011  . COLECTOMY    . Ruptured Disk  [id number]  . TOOTH EXTRACTION    . TOTAL ABDOMINAL HYSTERECTOMY  2016   Family History  Problem Relation Age of Onset  . Stroke Mother   . Breast cancer Mother        19's  . Parkinsonism Mother   . Depression Mother   . Heart attack Father   . Heart disease Brother   . Colon cancer Maternal Grandfather    Social History   Socioeconomic History  . Marital status: Married    Spouse name: Not on file  . Number of children: 2  . Years of education: masters  . Highest education level: Not on file  Occupational History  . Occupation: retired  Scientific laboratory technician  . Financial resource strain: Not on file  . Food insecurity    Worry: Not on file    Inability: Not on file  . Transportation needs    Medical: Not on file    Non-medical: Not on file  Tobacco Use  . Smoking status: Former Smoker    Types: Cigarettes  . Smokeless tobacco: Never  Used  Substance and Sexual Activity  . Alcohol use: Yes    Alcohol/week: 7.0 standard drinks    Types: 7 Glasses of wine per week  . Drug use: No  . Sexual activity: Yes    Partners: Male    Birth control/protection: Post-menopausal  Lifestyle  . Physical activity    Days per week: Not on file    Minutes per session: Not on file  . Stress: Not on file  Relationships  . Social Herbalist on phone: Not on file    Gets together: Not on file    Attends religious service: Not on file    Active member of club or organization: Not on file    Attends meetings of clubs or organizations: Not on file    Relationship status: Not on file  Other Topics Concern  . Not on file  Social History Narrative  . Not on file    Outpatient Encounter Medications as of 10/06/2019  Medication Sig  . acyclovir (ZOVIRAX) 200 MG capsule Take 1 capsule (200 mg total) by mouth 5 (five) times daily. For five days  per flare.  Marland Kitchen aspirin EC 81 MG tablet Take 81 mg by mouth.  Marland Kitchen atorvastatin (LIPITOR) 80 MG tablet Take 80 mg by mouth daily.  . carbidopa-levodopa (SINEMET IR) 25-100 MG tablet Take 1 tablet by mouth 3 (three) times daily.  . cyanocobalamin 1000 MCG tablet Take 1,000 mcg by mouth daily.  . fish oil-omega-3 fatty acids 1000 MG capsule Take 1,000 mg by mouth daily.  Marland Kitchen loratadine (CLARITIN) 10 MG tablet Take 10 mg by mouth as needed for allergies.  . nitroGLYCERIN (NITROSTAT) 0.4 MG SL tablet TAKE 1 TABLET UNDER TONGUE EVERY 5 MINUTES AS NEEDED FOR CHEST PAIN  . pramipexole (MIRAPEX) 1 MG tablet Take 4.5 mg by mouth 2 (two) times daily.   . vitamin C (ASCORBIC ACID) 500 MG tablet Take 500 mg by mouth.  . vitamin E 400 UNIT capsule Take 400 Units by mouth daily.   No facility-administered encounter medications on file as of 10/06/2019.     Activities of Daily Living In your present state of health, do you have any difficulty performing the following activities: 01/13/2019  Hearing? N   Vision? N  Difficulty concentrating or making decisions? N  Walking or climbing stairs? N  Dressing or bathing? N  Doing errands, shopping? N  Some recent data might be hidden    Patient Care Team: Virginia Crews, MD as PCP - General (Family Medicine)    Assessment:   This is a routine wellness examination for [name].  Exercise Activities and Dietary recommendations    Goals   None     Fall Risk: Fall Risk  01/13/2019 05/12/2017  Falls in the past year? 0 No    FALL RISK PREVENTION PERTAINING TO THE HOME:  Any stairs in or around the home? {YES/NO:21197} If so, are there any without handrails? {YES/NO:21197}  Home free of loose throw rugs in walkways, pet beds, electrical cords, etc? Yes  Adequate lighting in your home to reduce risk of falls? Yes   ASSISTIVE DEVICES UTILIZED TO PREVENT FALLS:  Life alert? {YES/NO:21197} Use of a cane, walker or w/c? {YES/NO:21197} Grab bars in the bathroom? {YES/NO:21197} Shower chair or bench in shower? {YES/NO:21197} Elevated toilet seat or a handicapped toilet? {YES/NO:21197}   TIMED UP AND GO:  Was the test performed? No .    Depression Screen PHQ 2/9 Scores 01/13/2019 05/12/2017 05/12/2017  PHQ - 2 Score 0 0 0  PHQ- 9 Score 1 - 0     Cognitive Function        Immunization History  Administered Date(s) Administered  . Influenza, High Dose Seasonal PF 10/07/2018  . Pneumococcal Conjugate-13 05/17/2014  . Pneumococcal Polysaccharide-23 11/10/2012  . Tdap 07/03/2011    Qualifies for Shingles Vaccine? Yes . Due for Shingrix. Education has been provided regarding the importance of this vaccine. Pt has been advised to call insurance company to determine out of pocket expense. Advised may also receive vaccine at local pharmacy or Health Dept. Verbalized acceptance and understanding.  Tdap: Up to date  Flu Vaccine: Due for Flu vaccine. Does the patient want to receive this vaccine today?  {YES/NO:21197}. Education  has been provided regarding the importance of this vaccine but still declined. Advised may receive this vaccine at local pharmacy or Health Dept. Aware to provide a copy of the vaccination record if obtained from local pharmacy or Health Dept. Verbalized acceptance and understanding.  Pneumococcal Vaccine: Completed series  Screening Tests Health Maintenance  Topic Date Due  . Hepatitis C Screening  01-25-1946  . INFLUENZA VACCINE  06/05/2019  . COLONOSCOPY  11/19/2019  . MAMMOGRAM  01/11/2021  . TETANUS/TDAP  07/02/2021  . DEXA SCAN  07/02/2022  . PNA vac Low Risk Adult  Completed    Cancer Screenings:  Colorectal Screening: Completed 11/18/14. Repeat every 5 years.  Mammogram: Completed 01/12/19.  Bone Density: Completed 07/02/17. Results reflect OSTEOPENIA. Repeat every 5 years.   Lung Cancer Screening: (Low Dose CT Chest recommended if Age 83-80 years, 30 pack-year currently smoking OR have quit w/in 15years.) {DOES NOT does:27190::"does not"} qualify.   Additional Screening:  Hepatitis C Screening: does qualify; ***  Vision Screening: Recommended annual ophthalmology exams for early detection of glaucoma and other disorders of the eye.  Dental Screening: Recommended annual dental exams for proper oral hygiene  Community Resource Referral:  CRR required this visit?  No       Plan:  I have personally reviewed and addressed the Medicare Annual Wellness questionnaire and have noted the following in the patient's chart:  A. Medical and social history B. Use of alcohol, tobacco or illicit drugs  C. Current medications and supplements D. Functional ability and status E.  Nutritional status F.  Physical activity G. Advance directives H. List of other physicians I.  Hospitalizations, surgeries, and ER visits in previous 12 months J.  [place] such as hearing and vision if needed, cognitive and depression L. Referrals and appointments   In addition, I have  reviewed and discussed with patient certain preventive protocols, quality metrics, and best practice recommendations. A written personalized care plan for preventive services as well as general preventive health recommendations were provided to patient. Nurse Health Advisor  Signed,    Mac Dowdell [place], [place]  [id number] Nurse Health Advisor   Nurse Notes: ***

## 2019-10-06 ENCOUNTER — Other Ambulatory Visit: Payer: Self-pay

## 2019-10-06 ENCOUNTER — Ambulatory Visit (INDEPENDENT_AMBULATORY_CARE_PROVIDER_SITE_OTHER): Payer: Medicare HMO

## 2019-10-06 ENCOUNTER — Telehealth: Payer: Self-pay

## 2019-10-06 ENCOUNTER — Encounter: Payer: Self-pay | Admitting: Family Medicine

## 2019-10-06 DIAGNOSIS — Z1211 Encounter for screening for malignant neoplasm of colon: Secondary | ICD-10-CM | POA: Diagnosis not present

## 2019-10-06 DIAGNOSIS — Z0000 Encounter for general adult medical examination without abnormal findings: Secondary | ICD-10-CM

## 2019-10-06 NOTE — Progress Notes (Addendum)
Subjective:   Felicia Snyder is a 73 y.o. female who presents for Medicare Annual (Subsequent) preventive examination.    This visit is being conducted through telemedicine due to the COVID-19 pandemic. This patient has given me verbal consent via doximity to conduct this visit, patient states they are participating from their home address. Some vital signs may be absent or patient reported.    Patient identification: identified by name, DOB, and current address  Review of Systems:  N/A  Cardiac Risk Factors include: advanced age (>36men, >65 women);dyslipidemia     Objective:     Vitals: There were no vitals taken for this visit.  There is no height or weight on file to calculate BMI. Unable to obtain vitals due to visit being conducted via telephonically.   Advanced Directives 10/06/2019 01/07/2018 04/15/2016  Does Patient Have a Medical Advance Directive? Yes Yes Yes  Type of Paramedic of [place];Living will [place];Living will [place];Living will  Copy of [place] in Chart? No - copy requested No - copy requested No - copy requested    Tobacco Social History   Tobacco Use  Smoking Status Former Smoker   Types: Cigarettes  Smokeless Tobacco Never Used  Tobacco Comment   quit at age 34-23     Counseling given: Not Answered Comment: quit at age 34-23   Clinical Intake:  Pre-visit preparation completed: Yes  Pain : No/denies pain Pain Score: 0-No pain     Nutritional Risks: None Diabetes: No  How often do you need to have someone help you when you read instructions, pamphlets, or other written materials from your doctor or pharmacy?: 1 - Never  Interpreter Needed?: No  Information entered by :: [facility], LPN  Past Medical History:  Diagnosis Date   Cancer ([place]) 1996   colon cancer   Cataract    Premature osteoporosis    Past Surgical History:  Procedure  Laterality Date   BLADDER SURGERY     Broken Ankle  2011   COLECTOMY     Ruptured Disk  [id number]   TOOTH EXTRACTION     TOTAL ABDOMINAL HYSTERECTOMY  2016   Family History  Problem Relation Age of Onset   Stroke Mother    Breast cancer Mother        74's   Parkinsonism Mother    Depression Mother    Heart attack Father    Heart disease Brother    Colon cancer Maternal Grandfather    Social History   Socioeconomic History   Marital status: Married    Spouse name: Not on file   Number of children: 2   Years of education: masters   Highest education level: Master's degree (e.g., MA, MS, MEng, MEd, MSW, MBA)  Occupational History   Occupation: retired  Scientist, product/process development strain: Not hard at [employer] insecurity    Worry: Never true    Inability: Never true   Transportation needs    Medical: No    Non-medical: No  Tobacco Use   Smoking status: Former Smoker    Types: Cigarettes   Smokeless tobacco: Never Used   Tobacco comment: quit at age 33-23  Substance and Sexual Activity   Alcohol use: Yes    Alcohol/week: 7.0 standard drinks    Types: 7 Glasses of wine per week   Drug use: No   Sexual activity: Yes    Partners: Male  Birth control/protection: Post-menopausal  Lifestyle   Physical activity    Days per week: 0 days    Minutes per session: 0 min   Stress: Not at all  Relationships   Social connections    Talks on phone: Patient refused    Gets together: Patient refused    Attends religious service: Patient refused    Active member of club or organization: Patient refused    Attends meetings of clubs or organizations: Patient refused    Relationship status: Patient refused  Other Topics Concern   Not on file  Social History Narrative   Not on file    Outpatient Encounter Medications as of 10/06/2019  Medication Sig   acyclovir (ZOVIRAX) 200 MG capsule Take 1 capsule (200 mg total) by mouth 5 (five)  times daily. For five days per flare.   aspirin EC 81 MG tablet Take 81 mg by mouth daily.    atorvastatin (LIPITOR) 80 MG tablet Take 80 mg by mouth daily.   carbidopa-levodopa (SINEMET IR) 25-100 MG tablet Take 1 tablet by mouth 2 (two) times daily.    cyanocobalamin 1000 MCG tablet Take 1,000 mcg by mouth daily.   fish oil-omega-3 fatty acids 1000 MG capsule Take 1,000 mg by mouth daily.   loratadine (CLARITIN) 10 MG tablet Take 10 mg by mouth as needed for allergies.   nitroGLYCERIN (NITROSTAT) 0.4 MG SL tablet TAKE 1 TABLET UNDER TONGUE EVERY 5 MINUTES AS NEEDED FOR CHEST PAIN   pramipexole (MIRAPEX) 1 MG tablet Take 4.5 mg by mouth 2 (two) times daily.    vitamin C (ASCORBIC ACID) 500 MG tablet Take 500 mg by mouth daily.    vitamin E 400 UNIT capsule Take 400 Units by mouth daily.   No facility-administered encounter medications on file as of 10/06/2019.     Activities of Daily Living In your present state of health, do you have any difficulty performing the following activities: 10/06/2019 01/13/2019  Hearing? N N  Vision? N N  Difficulty concentrating or making decisions? N N  Walking or climbing stairs? N N  Dressing or bathing? N N  Doing errands, shopping? N N  Preparing Food and eating ? N -  Using the Toilet? N -  In the past six months, have you accidently leaked urine? N -  Do you have problems with loss of bowel control? N -  Managing your Medications? N -  Managing your Finances? N -  Housekeeping or managing your Housekeeping? N -  Some recent data might be hidden    Patient Care Team: Virginia Crews, MD as PCP - General (Family Medicine) Lezlie Octave, MD as Referring Physician (Psychiatry) Isaias Cowman, MD as Consulting Physician (Cardiology) Levy Sjogren, MD as Referring Physician (Dermatology)    Assessment:   This is a routine wellness examination for Felicia Snyder.  Exercise Activities and Dietary recommendations Current  Exercise Habits: The patient does not participate in regular exercise at present, Exercise limited by: None identified  Goals     DIET - INCREASE WATER INTAKE     Recommend to drink at least 6-8 8oz glasses of water per day.       Fall Risk: Fall Risk  10/06/2019 01/13/2019 05/12/2017  Falls in the past year? 0 0 No  Number falls in past yr: 0 - -  Injury with Fall? 0 - -    FALL RISK PREVENTION PERTAINING TO THE HOME:  Any stairs in or around the home? Yes  If  so, are there any without handrails? No   Home free of loose throw rugs in walkways, pet beds, electrical cords, etc? Yes  Adequate lighting in your home to reduce risk of falls? Yes   ASSISTIVE DEVICES UTILIZED TO PREVENT FALLS:  Life alert? No  Use of a cane, walker or w/c? No  Grab bars in the bathroom? Yes  Shower chair or bench in shower? Yes  Elevated toilet seat or a handicapped toilet? No    TIMED UP AND GO:  Was the test performed? No .    Depression Screen PHQ 2/9 Scores 10/06/2019 01/13/2019 05/12/2017 05/12/2017  PHQ - 2 Score 0 0 0 0  PHQ- 9 Score - 1 - 0     Cognitive Function: Declined today.         Immunization History  Administered Date(s) Administered   Influenza, High Dose Seasonal PF 10/07/2018, 06/25/2019   Pneumococcal Conjugate-13 05/17/2014   Pneumococcal Polysaccharide-23 11/10/2012   Tdap 07/03/2011    Qualifies for Shingles Vaccine? Yes . Due for Shingrix. Pt has been advised to call insurance company to determine out of pocket expense. Advised may also receive vaccine at local pharmacy or Health Dept. Verbalized acceptance and understanding.  Tdap: Up to date  Flu Vaccine: Up to date  Pneumococcal Vaccine: Completed series  Screening Tests Health Maintenance  Topic Date Due   Hepatitis C Screening  [birth year]   COLONOSCOPY  11/19/2019   MAMMOGRAM  01/11/2021   TETANUS/TDAP  07/02/2021   DEXA SCAN  07/02/2022   INFLUENZA VACCINE  Completed   PNA vac Low  Risk Adult  Completed    Cancer Screenings:  Colorectal Screening: Completed 11/18/14. Repeat every 5 years.  Mammogram: Completed 01/12/19.   Bone Density: Completed 07/02/17. Results reflect OSTEOPENIA. Repeat every 5 years.   Lung Cancer Screening: (Low Dose CT Chest recommended if Age 15-80 years, 30 pack-year currently smoking OR have quit w/in 15years.) does not qualify.   Additional Screening:  Hepatitis C Screening: does qualify; and would like to this added to blood work orders at next in office apt.   Vision Screening: Recommended annual ophthalmology exams for early detection of glaucoma and other disorders of the eye.  Dental Screening: Recommended annual dental exams for proper oral hygiene  Community Resource Referral:  CRR required this visit?  No       Plan:  I have personally reviewed and addressed the Medicare Annual Wellness questionnaire and have noted the following in the patients chart:  A. Medical and social history B. Use of alcohol, tobacco or illicit drugs  C. Current medications and supplements D. Functional ability and status E.  Nutritional status F.  Physical activity G. Advance directives H. List of other physicians I.  Hospitalizations, surgeries, and ER visits in previous 12 months J.  [place] such as hearing and vision if needed, cognitive and depression L. Referrals and appointments   In addition, I have reviewed and discussed with patient certain preventive protocols, quality metrics, and best practice recommendations. A written personalized care plan for preventive services as well as general preventive health recommendations were provided to patient. Nurse Health Advisor  Signed,    Muath Hallam [place], [place]  [id number] Nurse Health Advisor   Nurse Notes: Pt to have the Hep C lab order added to yearly blood work orders at next in office visit.

## 2019-10-06 NOTE — Patient Instructions (Signed)
Felicia Snyder , Thank you for taking time to come for your Medicare Wellness Visit. I appreciate your ongoing commitment to your health goals. Please review the following plan we discussed and let me know if I can assist you in the future.   Screening recommendations/referrals: Colonoscopy: Up to date, due 11/2019- referral placed today.  Mammogram: Up to date, due 01/2021 Bone Density: Up to date, due 06/2022 Recommended yearly ophthalmology/optometry visit for glaucoma screening and checkup Recommended yearly dental visit for hygiene and checkup  Vaccinations: Influenza vaccine: Up to date Pneumococcal vaccine: Completed series Tdap vaccine: Up to date, due 06/2021 Shingles vaccine: Pt declines today.     Advanced directives: Please bring a copy of your POA (Power of Attorney) and/or Living Will to your next appointment.   Conditions/risks identified: Recommend to drink at least 6-8 8oz glasses of water per day.  Next appointment: 11/10/19 @ 11:00 AM with Dr Brita Romp.   Preventive Care 72 Years and Older, Female Preventive care refers to lifestyle choices and visits with your health care provider that can promote health and wellness. What does preventive care include?  A yearly physical exam. This is also called an annual well check.  Dental exams once or twice a year.  Routine eye exams. Ask your health care provider how often you should have your eyes checked.  Personal lifestyle choices, including:  Daily care of your teeth and gums.  Regular physical activity.  Eating a healthy diet.  Avoiding tobacco and drug use.  Limiting alcohol use.  Practicing safe sex.  Taking low-dose aspirin every day.  Taking vitamin and mineral supplements as recommended by your health care provider. What happens during an annual well check? The services and screenings done by your health care provider during your annual well check will depend on your age, overall health, lifestyle risk  factors, and family history of disease. Counseling  Your health care provider may ask you questions about your:  Alcohol use.  Tobacco use.  Drug use.  Emotional well-being.  Home and relationship well-being.  Sexual activity.  Eating habits.  History of falls.  Memory and ability to understand (cognition).  Work and work Statistician.  Reproductive health. Screening  You may have the following tests or measurements:  Height, weight, and BMI.  Blood pressure.  Lipid and cholesterol levels. These may be checked every 5 years, or more frequently if you are over 57 years old.  Skin check.  Lung cancer screening. You may have this screening every year starting at age 26 if you have a 30-pack-year history of smoking and currently smoke or have quit within the past 15 years.  Fecal occult blood test (FOBT) of the stool. You may have this test every year starting at age 36.  Flexible sigmoidoscopy or colonoscopy. You may have a sigmoidoscopy every 5 years or a colonoscopy every 10 years starting at age 21.  Hepatitis C blood test.  Hepatitis B blood test.  Sexually transmitted disease (STD) testing.  Diabetes screening. This is done by checking your blood sugar (glucose) after you have not eaten for a while (fasting). You may have this done every 1-3 years.  Bone density scan. This is done to screen for osteoporosis. You may have this done starting at age 52.  Mammogram. This may be done every 1-2 years. Talk to your health care provider about how often you should have regular mammograms. Talk with your health care provider about your test results, treatment options, and if necessary,  the need for more tests. Vaccines  Your health care provider may recommend certain vaccines, such as:  Influenza vaccine. This is recommended every year.  Tetanus, diphtheria, and acellular pertussis (Tdap, Td) vaccine. You may need a Td booster every 10 years.  Zoster vaccine. You  may need this after age 94.  Pneumococcal 13-valent conjugate (PCV13) vaccine. One dose is recommended after age 102.  Pneumococcal polysaccharide (PPSV23) vaccine. One dose is recommended after age 4. Talk to your health care provider about which screenings and vaccines you need and how often you need them. This information is not intended to replace advice given to you by your health care provider. Make sure you discuss any questions you have with your health care provider. Document Released: 11/17/2015 Document Revised: 07/10/2016 Document Reviewed: 08/22/2015 Elsevier Interactive Patient Education  2017 [place] Prevention in the Home Falls can cause injuries. They can happen to people of all ages. There are many things you can do to make your home safe and to help prevent falls. What can I do on the outside of my home?  Regularly fix the edges of walkways and driveways and fix any cracks.  Remove anything that might make you trip as you walk through a door, such as a raised step or threshold.  Trim any bushes or trees on the path to your home.  Use bright outdoor lighting.  Clear any walking paths of anything that might make someone trip, such as rocks or tools.  Regularly check to see if handrails are loose or broken. Make sure that both sides of any steps have handrails.  Any raised decks and porches should have guardrails on the edges.  Have any leaves, snow, or ice cleared regularly.  Use sand or salt on walking paths during winter.  Clean up any spills in your garage right away. This includes oil or grease spills. What can I do in the bathroom?  Use night lights.  Install grab bars by the toilet and in the tub and shower. Do not use towel bars as grab bars.  Use non-skid mats or decals in the tub or shower.  If you need to sit down in the shower, use a plastic, non-slip stool.  Keep the floor dry. Clean up any water that spills on the floor as soon as  it happens.  Remove soap buildup in the tub or shower regularly.  Attach bath mats securely with double-sided non-slip rug tape.  Do not have throw rugs and other things on the floor that can make you trip. What can I do in the bedroom?  Use night lights.  Make sure that you have a light by your bed that is easy to reach.  Do not use any sheets or blankets that are too big for your bed. They should not hang down onto the floor.  Have a firm chair that has side arms. You can use this for support while you get dressed.  Do not have throw rugs and other things on the floor that can make you trip. What can I do in the kitchen?  Clean up any spills right away.  Avoid walking on wet floors.  Keep items that you use a lot in easy-to-reach places.  If you need to reach something above you, use a strong step stool that has a grab bar.  Keep electrical cords out of the way.  Do not use floor polish or wax that makes floors slippery. If you must use  wax, use non-skid floor wax.  Do not have throw rugs and other things on the floor that can make you trip. What can I do with my stairs?  Do not leave any items on the stairs.  Make sure that there are handrails on both sides of the stairs and use them. Fix handrails that are broken or loose. Make sure that handrails are as long as the stairways.  Check any carpeting to make sure that it is firmly attached to the stairs. Fix any carpet that is loose or worn.  Avoid having throw rugs at the top or bottom of the stairs. If you do have throw rugs, attach them to the floor with carpet tape.  Make sure that you have a light switch at the top of the stairs and the bottom of the stairs. If you do not have them, ask someone to add them for you. What else can I do to help prevent falls?  Wear shoes that:  Do not have high heels.  Have rubber bottoms.  Are comfortable and fit you well.  Are closed at the toe. Do not wear sandals.  If  you use a stepladder:  Make sure that it is fully opened. Do not climb a closed stepladder.  Make sure that both sides of the stepladder are locked into place.  Ask someone to hold it for you, if possible.  Clearly mark and make sure that you can see:  Any grab bars or handrails.  First and last steps.  Where the edge of each step is.  Use tools that help you move around (mobility aids) if they are needed. These include:  Canes.  Walkers.  Scooters.  Crutches.  Turn on the lights when you go into a dark area. Replace any light bulbs as soon as they burn out.  Set up your furniture so you have a clear path. Avoid moving your furniture around.  If any of your floors are uneven, fix them.  If there are any pets around you, be aware of where they are.  Review your medicines with your doctor. Some medicines can make you feel dizzy. This can increase your chance of falling. Ask your doctor what other things that you can do to help prevent falls. This information is not intended to replace advice given to you by your health care provider. Make sure you discuss any questions you have with your health care provider. Document Released: 08/17/2009 Document Revised: 03/28/2016 Document Reviewed: 11/25/2014 Elsevier Interactive Patient Education  2017 [employer].

## 2019-10-06 NOTE — Telephone Encounter (Signed)
Patient has been contacted to schedule her colonoscopy.  She states that she would like to schedule for sometime in January 2021, however didn't want to schedule without looking at her calendar.  She will call back later to schedule, and I've sent blank instruction package for her to read over prior to scheduling.  Thanks [employer]

## 2019-10-19 ENCOUNTER — Encounter: Payer: Self-pay | Admitting: *Deleted

## 2019-11-10 ENCOUNTER — Encounter: Payer: Self-pay | Admitting: Family Medicine

## 2019-11-10 ENCOUNTER — Other Ambulatory Visit: Payer: Self-pay

## 2019-11-10 ENCOUNTER — Ambulatory Visit (INDEPENDENT_AMBULATORY_CARE_PROVIDER_SITE_OTHER): Payer: Medicare HMO | Admitting: Family Medicine

## 2019-11-10 VITALS — BP 137/79 | HR 68 | Temp 97.6°F | Resp 16 | Ht 64.0 in | Wt 169.0 lb

## 2019-11-10 DIAGNOSIS — G20 Parkinson's disease: Secondary | ICD-10-CM

## 2019-11-10 DIAGNOSIS — I2510 Atherosclerotic heart disease of native coronary artery without angina pectoris: Secondary | ICD-10-CM

## 2019-11-10 DIAGNOSIS — M79605 Pain in left leg: Secondary | ICD-10-CM | POA: Diagnosis not present

## 2019-11-10 DIAGNOSIS — Z1231 Encounter for screening mammogram for malignant neoplasm of breast: Secondary | ICD-10-CM | POA: Diagnosis not present

## 2019-11-10 DIAGNOSIS — Z0000 Encounter for general adult medical examination without abnormal findings: Secondary | ICD-10-CM

## 2019-11-10 DIAGNOSIS — Z0001 Encounter for general adult medical examination with abnormal findings: Secondary | ICD-10-CM | POA: Diagnosis not present

## 2019-11-10 DIAGNOSIS — Z1159 Encounter for screening for other viral diseases: Secondary | ICD-10-CM | POA: Diagnosis not present

## 2019-11-10 DIAGNOSIS — E782 Mixed hyperlipidemia: Secondary | ICD-10-CM

## 2019-11-10 MED ORDER — VALACYCLOVIR HCL 500 MG PO TABS: 500.0000 mg | ORAL_TABLET | Freq: Two times a day (BID) | ORAL | 2 refills | Status: AC

## 2019-11-10 MED ORDER — MELOXICAM 7.5 MG PO TABS: 7.5000 mg | ORAL_TABLET | Freq: Every day | ORAL | 0 refills | Status: DC

## 2019-11-10 MED ORDER — BACLOFEN 10 MG PO TABS: 10.0000 mg | ORAL_TABLET | Freq: Three times a day (TID) | ORAL | 0 refills | Status: DC | PRN

## 2019-11-10 NOTE — Patient Instructions (Addendum)
Referral sent to [facility] - Phone [phone]        Preventive Care 65 Years and Older, Female Preventive care refers to lifestyle choices and visits with your health care provider that can promote health and wellness. This includes:  A yearly physical exam. This is also called an annual well check.  Regular dental and eye exams.  Immunizations.  Screening for certain conditions.  Healthy lifestyle choices, such as diet and exercise. What can I expect for my preventive care visit? Physical exam Your health care provider will check:  Height and weight. These may be used to calculate body mass index (BMI), which is a measurement that tells if you are at a healthy weight.  Heart rate and blood pressure.  Your skin for abnormal spots. Counseling Your health care provider may ask you questions about:  Alcohol, tobacco, and drug use.  Emotional well-being.  Home and relationship well-being.  Sexual activity.  Eating habits.  History of falls.  Memory and ability to understand (cognition).  Work and work Statistician.  Pregnancy and menstrual history. What immunizations do I need?  Influenza (flu) vaccine  This is recommended every year. Tetanus, diphtheria, and pertussis (Tdap) vaccine  You may need a Td booster every 10 years. Varicella (chickenpox) vaccine  You may need this vaccine if you have not already been vaccinated. Zoster (shingles) vaccine  You may need this after age 81. Pneumococcal conjugate (PCV13) vaccine  One dose is recommended after age 72. Pneumococcal polysaccharide (PPSV23) vaccine  One dose is recommended after age 1. Measles, mumps, and rubella (MMR) vaccine  You may need at least one dose of MMR if you were born in 1957 or later. You may also need a second dose. Meningococcal conjugate (MenACWY) vaccine  You may need this if you have certain conditions. Hepatitis A vaccine  You may need this if you have certain  conditions or if you travel or work in places where you may be exposed to hepatitis A. Hepatitis B vaccine  You may need this if you have certain conditions or if you travel or work in places where you may be exposed to hepatitis B. Haemophilus influenzae type b (Hib) vaccine  You may need this if you have certain conditions. You may receive vaccines as individual doses or as more than one vaccine together in one shot (combination vaccines). Talk with your health care provider about the risks and benefits of combination vaccines. What tests do I need? Blood tests  Lipid and cholesterol levels. These may be checked every 5 years, or more frequently depending on your overall health.  Hepatitis C test.  Hepatitis B test. Screening  Lung cancer screening. You may have this screening every year starting at age 55 if you have a 30-pack-year history of smoking and currently smoke or have quit within the past 15 years.  Colorectal cancer screening. All adults should have this screening starting at age 73 and continuing until age 83. Your health care provider may recommend screening at age 29 if you are at increased risk. You will have tests every 1-10 years, depending on your results and the type of screening test.  Diabetes screening. This is done by checking your blood sugar (glucose) after you have not eaten for a while (fasting). You may have this done every 1-3 years.  Mammogram. This may be done every 1-2 years. Talk with your health care provider about how often you should have regular mammograms.  BRCA-related cancer screening. This  may be done if you have a family history of breast, ovarian, tubal, or peritoneal cancers. Other tests  Sexually transmitted disease (STD) testing.  Bone density scan. This is done to screen for osteoporosis. You may have this done starting at age 63. Follow these instructions at home: Eating and drinking  Eat a diet that includes fresh fruits and  vegetables, whole grains, lean protein, and low-fat dairy products. Limit your intake of foods with high amounts of sugar, saturated fats, and salt.  Take vitamin and mineral supplements as recommended by your health care provider.  Do not drink alcohol if your health care provider tells you not to drink.  If you drink alcohol: ? Limit how much you have to 0-1 drink a day. ? Be aware of how much alcohol is in your drink. In the [place] one drink equals one 12 oz bottle of beer (355 mL), one 5 oz glass of wine (148 mL), or one 1 oz glass of hard liquor (44 mL). Lifestyle  Take daily care of your teeth and gums.  Stay active. Exercise for at least 30 minutes on 5 or more days each week.  Do not use any products that contain nicotine or tobacco, such as cigarettes, e-cigarettes, and chewing tobacco. If you need help quitting, ask your health care provider.  If you are sexually active, practice safe sex. Use a condom or other form of protection in order to prevent STIs (sexually transmitted infections).  Talk with your health care provider about taking a low-dose aspirin or statin. What's next?  Go to your health care provider once a year for a well check visit.  Ask your health care provider how often you should have your eyes and teeth checked.  Stay up to date on all vaccines. This information is not intended to replace advice given to you by your health care provider. Make sure you discuss any questions you have with your health care provider. Document Revised: 10/15/2018 Document Reviewed: 10/15/2018 Elsevier Patient Education  2020 [employer].

## 2019-11-10 NOTE — Assessment & Plan Note (Signed)
Continue statin at current dose Repeat lipid panel and CMP

## 2019-11-10 NOTE — Assessment & Plan Note (Signed)
History of MI Continue ASA and statin Followed by Cardiology No chest pain

## 2019-11-10 NOTE — Assessment & Plan Note (Signed)
New problem Has seemed to gradually worsen since a mild trauma to that leg No signs of DVT on exam No signs of joint pain or possible fractures No indication for imaging at this time Discussed return precautions on DVT signs and symptoms Seems to have some muscle strain Advised rest, ice, and NSAIDs Referral to Ortho if not improving

## 2019-11-10 NOTE — Progress Notes (Signed)
Patient: Felicia Snyder, Female    DOB: 07/02/1946, 74 y.o.   MRN: [id number] Visit Date: 11/10/2019  Today's Provider: Lavon Paganini, MD   Chief Complaint  Patient presents with  . Annual Exam  . Leg Pain   Subjective:     Complete Physical Felicia Snyder is a 74 y.o. female. She feels fairly well. She reports exercising none. She reports she is sleeping fairly well. 10/06/2019 AWV with [facility] 01/12/2019 Mammogram-BI-RADS 1 07/02/2017 BMD-Osteopenia  ---------------------------------------------------------  Patient C/O right leg pain. Patient reports she tripped and put all her weight on to the right leg.  She did not fall, but rather stumbled down the stairs and landed hard on her right foot.  This occurred about 3 weeks ago.  She is functioning well, with some mild pain, until yesterday.  Patient reports that since yesterday she has not been able to bear weight on right leg pain.  It was hurting her some and seem to be gradually worsening to lay on it in bed or go up and down the stairs.  She is tried Aleve and oxycodone with relief.  There was no bruising, swelling, redness then or now.  She has been active since this occurred, with no period of immobility.  Review of Systems  Constitutional: Negative.   HENT: Negative.   Eyes: Negative.   Respiratory: Negative.   Cardiovascular: Negative.   Gastrointestinal: Negative.   Endocrine: Negative.   Genitourinary: Negative.   Musculoskeletal: Positive for myalgias.  Skin: Negative.   Allergic/Immunologic: Negative.   Neurological: Negative.   Hematological: Negative.   Psychiatric/Behavioral: Negative.     Social History   Socioeconomic History  . Marital status: Married    Spouse name: Not on file  . Number of children: 2  . Years of education: masters  . Highest education level: Master's degree (e.g., MA, MS, MEng, MEd, MSW, MBA)  Occupational History  . Occupation: retired  Tobacco Use  .  Smoking status: Former Smoker    Types: Cigarettes  . Smokeless tobacco: Never Used  . Tobacco comment: quit at age [age]  Substance and Sexual Activity  . Alcohol use: Yes    Alcohol/week: 7.0 standard drinks    Types: 7 Glasses of wine per week  . Drug use: No  . Sexual activity: Yes    Partners: Male    Birth control/protection: Post-menopausal  Other Topics Concern  . Not on file  Social History Narrative  . Not on file   Social Determinants of Health   Financial Resource Strain: Low Risk   . Difficulty of Paying Living Expenses: Not hard at all  Food Insecurity: No Food Insecurity  . Worried About Charity fundraiser in the Last Year: Never true  . Ran Out of Food in the Last Year: Never true  Transportation Needs: No Transportation Needs  . Lack of Transportation (Medical): No  . Lack of Transportation (Non-Medical): No  Physical Activity: Inactive  . Days of Exercise per Week: 0 days  . Minutes of Exercise per Session: 0 min  Stress: No Stress Concern Present  . Feeling of Stress : Not at all  Social Connections: Unknown  . Frequency of Communication with Friends and Family: Patient refused  . Frequency of Social Gatherings with Friends and Family: Patient refused  . Attends Religious Services: Patient refused  . Active Member of Clubs or Organizations: Patient refused  . Attends Archivist Meetings: Patient refused  . Marital  Status: Patient refused  Intimate Partner Violence: Unknown  . Fear of Current or Ex-Partner: Patient refused  . Emotionally Abused: Patient refused  . Physically Abused: Patient refused  . Sexually Abused: Patient refused    Past Medical History:  Diagnosis Date  . Cancer [facility]) 1996   colon cancer  . Cataract   . Premature osteoporosis      Patient Active Problem List   Diagnosis Date Noted  . CAD (coronary artery disease) 01/15/2019  . Hyperlipidemia, mixed 05/12/2017  . Allergic rhinitis 05/03/2016  . Herpes simplex  05/03/2016  . History of malignant melanoma 05/03/2016  . OP (osteoporosis) 05/03/2016  . History of MI (myocardial infarction) 10/02/2015  . Incomplete uterine prolapse 12/27/2014  . Chronic constipation 12/26/2014  . H/O malignant neoplasm of colon 10/13/2014  . Parkinson's disease ([place]) 05/04/2013    Past Surgical History:  Procedure Laterality Date  . BLADDER SURGERY    . Broken Ankle  2011  . COLECTOMY    . Ruptured Disk  [id number]  . TOOTH EXTRACTION    . TOTAL ABDOMINAL HYSTERECTOMY  2016    Her family history includes Breast cancer in her mother; Colon cancer in her maternal grandfather; Depression in her mother; Heart attack in her father; Heart disease in her brother; Parkinsonism in her mother; Stroke in her mother.   Current Outpatient Medications:  .  acyclovir (ZOVIRAX) 200 MG capsule, Take 1 capsule (200 mg total) by mouth 5 (five) times daily. For five days per flare., Disp: 25 capsule, Rfl: 5 .  aspirin EC 81 MG tablet, Take 81 mg by mouth daily. , Disp: , Rfl:  .  atorvastatin (LIPITOR) 80 MG tablet, Take 80 mg by mouth daily., Disp: , Rfl:  .  carbidopa-levodopa (SINEMET IR) 25-100 MG tablet, Take 1 tablet by mouth 2 (two) times daily. , Disp: , Rfl:  .  cyanocobalamin 1000 MCG tablet, Take 1,000 mcg by mouth daily., Disp: , Rfl:  .  fish oil-omega-3 fatty acids 1000 MG capsule, Take 1,000 mg by mouth daily., Disp: , Rfl:  .  loratadine (CLARITIN) 10 MG tablet, Take 10 mg by mouth as needed for allergies., Disp: , Rfl:  .  nitroGLYCERIN (NITROSTAT) 0.4 MG SL tablet, TAKE 1 TABLET UNDER TONGUE EVERY 5 MINUTES AS NEEDED FOR CHEST PAIN, Disp: , Rfl:  .  pramipexole (MIRAPEX) 1 MG tablet, Take 4.5 mg by mouth 2 (two) times daily. , Disp: , Rfl:  .  vitamin C (ASCORBIC ACID) 500 MG tablet, Take 500 mg by mouth daily. , Disp: , Rfl:  .  vitamin E 400 UNIT capsule, Take 400 Units by mouth daily., Disp: , Rfl:   Patient Care Team: Virginia Crews, MD as PCP -  General (Family Medicine) Lezlie Octave, MD as Referring Physician (Psychiatry) Isaias Cowman, MD as Consulting Physician (Cardiology) Levy Sjogren, MD as Referring Physician (Dermatology)     Objective:    Vitals: BP 137/79 (BP Location: Left Arm, Patient Position: Sitting, Cuff Size: Large)   Pulse 68   Temp 97.6 F (36.4 C) (Temporal)   Resp 16   Ht 5\' 4"  (1.626 m)   Wt 169 lb (76.7 kg)   BMI 29.01 kg/m   Physical Exam Vitals reviewed.  Constitutional:      General: She is not in acute distress.    Appearance: Normal appearance. She is well-developed. She is not diaphoretic.     Comments: Sitting in wheelchair  HENT:     Head:  Normocephalic and atraumatic.     Right Ear: External ear normal.     Left Ear: External ear normal.     Nose: Nose normal.     Mouth/Throat:     Mouth: Mucous membranes are moist.     Pharynx: Oropharynx is clear. No oropharyngeal exudate.  Eyes:     General: No scleral icterus.    Conjunctiva/sclera: Conjunctivae normal.     Pupils: Pupils are equal, round, and reactive to light.  Neck:     Thyroid: No thyromegaly.  Cardiovascular:     Rate and Rhythm: Normal rate and regular rhythm.     Pulses: Normal pulses.     Heart sounds: Normal heart sounds. No murmur.  Pulmonary:     Effort: Pulmonary effort is normal. No respiratory distress.     Breath sounds: Normal breath sounds. No wheezing or rales.  Abdominal:     General: There is no distension.     Palpations: Abdomen is soft.     Tenderness: There is no abdominal tenderness.  Musculoskeletal:        General: No deformity.     Cervical back: Neck supple.     Right lower leg: No edema.     Left lower leg: No edema.     Comments: She does have some minor tenderness to palpation over her right greater trochanteric bursa.  Normal hip range of motion.  Negative straight leg raise.  No tenderness to palpation over large muscle groups or any joints.  No obvious deformities.   No swelling notable.  No calf asymmetry.  Negative Homans' sign.  No palpable cords in her calves.  Able to stand and take a few steps, though with pain.  Range of motion of joints is intact.  She does have pain with knee extension and her hamstrings and calf.  Strength is intact, though she does have pain with resisted range of motion.  Lymphadenopathy:     Cervical: No cervical adenopathy.  Skin:    General: Skin is warm and dry.     Capillary Refill: Capillary refill takes less than 2 seconds.     Findings: No rash.  Neurological:     Mental Status: She is alert and oriented to person, place, and time. Mental status is at baseline.     Sensory: No sensory deficit.     Gait: Gait abnormal (antalgic).  Psychiatric:        Mood and Affect: Mood normal.        Behavior: Behavior normal.        Thought Content: Thought content normal.     Activities of Daily Living In your present state of health, do you have any difficulty performing the following activities: 10/06/2019 01/13/2019  Hearing? N N  Vision? N N  Difficulty concentrating or making decisions? N N  Walking or climbing stairs? N N  Dressing or bathing? N N  Doing errands, shopping? N N  Preparing Food and eating ? N -  Using the Toilet? N -  In the past six months, have you accidently leaked urine? N -  Do you have problems with loss of bowel control? N -  Managing your Medications? N -  Managing your Finances? N -  Housekeeping or managing your Housekeeping? N -  Some recent data might be hidden    Fall Risk Assessment Fall Risk  10/06/2019 01/13/2019 05/12/2017  Falls in the past year? 0 0 No  Number falls in past yr: 0 - -  Injury with Fall? 0 - -     Depression Screen PHQ 2/9 Scores 10/06/2019 01/13/2019 05/12/2017 05/12/2017  PHQ - 2 Score 0 0 0 0  PHQ- 9 Score - 1 - 0    No flowsheet data found.     Assessment & Plan:    Annual Physical Reviewed patient's Family Medical History Reviewed and updated list of  patient's medical providers Assessment of cognitive impairment was done Assessed patient's functional ability Established a written schedule for health screening [place] Completed and Reviewed  Exercise Activities and Dietary recommendations Goals    . DIET - INCREASE WATER INTAKE     Recommend to drink at least 6-8 8oz glasses of water per day.       Immunization History  Administered Date(s) Administered  . Influenza, High Dose Seasonal PF 10/07/2018, 06/25/2019  . Pneumococcal Conjugate-13 05/17/2014  . Pneumococcal Polysaccharide-23 11/10/2012  . Tdap 07/03/2011    Health Maintenance  Topic Date Due  . Hepatitis C Screening  1946/06/06  . COLONOSCOPY  11/19/2019  . MAMMOGRAM  01/11/2021  . TETANUS/TDAP  07/02/2021  . DEXA SCAN  07/02/2022  . INFLUENZA VACCINE  Completed  . PNA vac Low Risk Adult  Completed     Discussed health benefits of physical activity, and encouraged her to engage in regular exercise appropriate for her age and condition.    ------------------------------------------------------------------------------------------------------------  Problem List Items Addressed This Visit      Cardiovascular and Mediastinum   CAD (coronary artery disease)    History of MI Continue ASA and statin Followed by Cardiology No chest pain        Nervous and Auditory   Parkinson's disease ([place])    Chronic and stable  Followed by Neurology Continue current medications        Other   Hyperlipidemia, mixed    Continue statin at current dose Repeat lipid panel and CMP      Left leg pain    New problem Has seemed to gradually worsen since a mild trauma to that leg No signs of DVT on exam No signs of joint pain or possible fractures No indication for imaging at this time Discussed return precautions on DVT signs and symptoms Seems to have some muscle strain Advised rest, ice, and NSAIDs Referral to Ortho if not improving        Relevant Orders   Ambulatory referral to Orthopedic Surgery    Other Visit Diagnoses    Encounter for annual physical exam    -  Primary   Relevant Orders   CBC with Differential/Platelet   Comprehensive metabolic panel   Lipid Panel With LDL/HDL Ratio   Hepatitis C Antibody   MM 3D SCREEN BREAST BILATERAL   Need for hepatitis C screening test       Relevant Orders   Hepatitis C Antibody   Encounter for screening mammogram for malignant neoplasm of breast       Relevant Orders   MM 3D SCREEN BREAST BILATERAL       Return in about 1 year (around 11/09/2020) for [place].   The entirety of the information documented in the History of Present Illness, Review of Systems and Physical Exam were personally obtained by me. Portions of this information were initially documented by Lynford Humphrey, CMA and reviewed by me for thoroughness and accuracy.    Makynzie Dobesh, Dionne Bucy, MD MPH [facility]

## 2019-11-10 NOTE — Assessment & Plan Note (Signed)
Chronic and stable  Followed by Neurology Continue current medications

## 2019-11-18 ENCOUNTER — Telehealth: Payer: Self-pay

## 2019-11-18 NOTE — Telephone Encounter (Signed)
Copied from [place] [id number]. Topic: General - Other >> Nov 18, 2019  3:50 PM Leward Quan A wrote: Reason for CRM: Patient called to inform Dr B that she is feeling more pain in the right leg which she had an injury last week. States that she will contact [facility] and see if the referral that was sent is still good for her to be seen there. Please advise

## 2019-11-19 NOTE — Telephone Encounter (Signed)
Referral should still be valid from last week.  If any unilateral calf swelling or erythema, may need to f/u with [place], but otherwise, ok to wait for Ortho appt.

## 2019-11-19 NOTE — Telephone Encounter (Signed)
Patient reports she was seen today at [facility].

## 2019-12-03 ENCOUNTER — Other Ambulatory Visit: Payer: Self-pay | Admitting: Family Medicine

## 2019-12-16 ENCOUNTER — Telehealth: Payer: Self-pay

## 2019-12-16 LAB — COMPREHENSIVE METABOLIC PANEL
ALT: 28 IU/L (ref 0–32)
AST: 54 IU/L — ABNORMAL HIGH (ref 0–40)
Albumin/Globulin Ratio: 2.1 (ref 1.2–2.2)
Albumin: 4.2 g/dL (ref 3.7–4.7)
Alkaline Phosphatase: 107 IU/L (ref 39–117)
BUN/Creatinine Ratio: 23 (ref 12–28)
BUN: 16 mg/dL (ref 8–27)
Bilirubin Total: 0.4 mg/dL (ref 0.0–1.2)
CO2: 22 mmol/L (ref 20–29)
Calcium: 10.2 mg/dL (ref 8.7–10.3)
Chloride: 101 mmol/L (ref 96–106)
Creatinine, Ser: 0.71 mg/dL (ref 0.57–1.00)
GFR calc Af Amer: 98 mL/min/{1.73_m2} (ref >59)
GFR calc non Af Amer: 85 mL/min/{1.73_m2} (ref >59)
Globulin, Total: 2 g/dL (ref 1.5–4.5)
Glucose: 101 mg/dL — ABNORMAL HIGH (ref 65–99)
Potassium: 4.1 mmol/L (ref 3.5–5.2)
Sodium: 139 mmol/L (ref 134–144)
Total Protein: 6.2 g/dL (ref 6.0–8.5)

## 2019-12-16 LAB — CBC WITH DIFFERENTIAL/PLATELET
Basophils Absolute: 0 10*3/uL (ref 0.0–0.2)
Basos: 0 %
EOS (ABSOLUTE): 0 10*3/uL (ref 0.0–0.4)
Eos: 0 %
Hematocrit: 37.4 % (ref 34.0–46.6)
Hemoglobin: 12.9 g/dL (ref 11.1–15.9)
Immature Grans (Abs): 0 10*3/uL (ref 0.0–0.1)
Immature Granulocytes: 0 %
Lymphocytes Absolute: 1.5 10*3/uL (ref 0.7–3.1)
Lymphs: 21 %
MCH: 30.5 pg (ref 26.6–33.0)
MCHC: 34.5 g/dL (ref 31.5–35.7)
MCV: 88 fL (ref 79–97)
Monocytes Absolute: 0.5 10*3/uL (ref 0.1–0.9)
Monocytes: 7 %
Neutrophils Absolute: 4.9 10*3/uL (ref 1.4–7.0)
Neutrophils: 72 %
Platelets: 255 10*3/uL (ref 150–450)
RBC: 4.23 x10E6/uL (ref 3.77–5.28)
RDW: 12.4 % (ref 11.7–15.4)
WBC: 6.9 10*3/uL (ref 3.4–10.8)

## 2019-12-16 LAB — HEPATITIS C ANTIBODY: Hep C Virus Ab: <0.1 s/co ratio (ref 0.0–0.9)

## 2019-12-16 LAB — LIPID PANEL WITH LDL/HDL RATIO
Cholesterol, Total: 135 mg/dL (ref 100–199)
HDL: 70 mg/dL (ref >39)
LDL Chol Calc (NIH): 52 mg/dL (ref 0–99)
LDL/HDL Ratio: 0.7 ratio (ref 0.0–3.2)
Triglycerides: 60 mg/dL (ref 0–149)
VLDL Cholesterol Cal: 13 mg/dL (ref 5–40)

## 2019-12-16 NOTE — Telephone Encounter (Signed)
Patient advised as below.  

## 2019-12-16 NOTE — Telephone Encounter (Signed)
-----   Message from Virginia Crews, MD sent at 12/16/2019  8:30 AM EST ----- Normal labs, except slightly elevated blood sugar and AST (liver function test).  Recommend avoiding alcohol and staying well hydrated.  Recheck CMP fasting in 1 month.

## 2019-12-28 ENCOUNTER — Other Ambulatory Visit: Payer: Self-pay | Admitting: Physician Assistant

## 2019-12-28 DIAGNOSIS — M2391 Unspecified internal derangement of right knee: Secondary | ICD-10-CM

## 2020-01-05 ENCOUNTER — Ambulatory Visit
Admission: RE | Admit: 2020-01-05 | Discharge: 2020-01-05 | Disposition: A | Discharge status: Home / Self Care | Payer: Medicare HMO | Source: Ambulatory Visit | Attending: Physician Assistant | Admitting: Physician Assistant

## 2020-01-05 ENCOUNTER — Other Ambulatory Visit: Payer: Self-pay

## 2020-01-05 DIAGNOSIS — M2391 Unspecified internal derangement of right knee: Secondary | ICD-10-CM

## 2020-01-14 ENCOUNTER — Encounter: Payer: Self-pay | Admitting: Family Medicine

## 2020-01-14 DIAGNOSIS — R748 Abnormal levels of other serum enzymes: Secondary | ICD-10-CM

## 2020-01-20 ENCOUNTER — Telehealth: Payer: Self-pay

## 2020-01-20 LAB — COMPREHENSIVE METABOLIC PANEL
ALT: 29 IU/L (ref 0–32)
AST: 23 IU/L (ref 0–40)
Albumin/Globulin Ratio: 2.3 — ABNORMAL HIGH (ref 1.2–2.2)
Albumin: 4.3 g/dL (ref 3.7–4.7)
Alkaline Phosphatase: 96 IU/L (ref 39–117)
BUN/Creatinine Ratio: 17 (ref 12–28)
BUN: 13 mg/dL (ref 8–27)
Bilirubin Total: 0.4 mg/dL (ref 0.0–1.2)
CO2: 25 mmol/L (ref 20–29)
Calcium: 10 mg/dL (ref 8.7–10.3)
Chloride: 104 mmol/L (ref 96–106)
Creatinine, Ser: 0.75 mg/dL (ref 0.57–1.00)
GFR calc Af Amer: 92 mL/min/{1.73_m2} (ref >59)
GFR calc non Af Amer: 80 mL/min/{1.73_m2} (ref >59)
Globulin, Total: 1.9 g/dL (ref 1.5–4.5)
Glucose: 97 mg/dL (ref 65–99)
Potassium: 4 mmol/L (ref 3.5–5.2)
Sodium: 143 mmol/L (ref 134–144)
Total Protein: 6.2 g/dL (ref 6.0–8.5)

## 2020-01-20 NOTE — Telephone Encounter (Signed)
[employer]   Result Notes and Comments to Patient Comment seen by patient Felicia Snyder on 01/20/2020 8:25

## 2020-01-20 NOTE — Telephone Encounter (Signed)
-----   Message from Virginia Crews, MD sent at 01/20/2020  8:16 AM EDT ----- Normal labs

## 2020-04-07 ENCOUNTER — Ambulatory Visit
Admission: RE | Admit: 2020-04-07 | Discharge: 2020-04-07 | Disposition: A | Discharge status: Home / Self Care | Payer: Medicare HMO | Source: Ambulatory Visit | Attending: Family Medicine | Admitting: Family Medicine

## 2020-04-07 ENCOUNTER — Telehealth: Payer: Self-pay

## 2020-04-07 DIAGNOSIS — Z0000 Encounter for general adult medical examination without abnormal findings: Secondary | ICD-10-CM

## 2020-04-07 DIAGNOSIS — Z1231 Encounter for screening mammogram for malignant neoplasm of breast: Secondary | ICD-10-CM

## 2020-04-07 NOTE — Telephone Encounter (Signed)
Pt advised.   Thanks,   -Araseli Sherry  

## 2020-04-07 NOTE — Telephone Encounter (Signed)
-----   Message from Virginia Crews, MD sent at 04/07/2020  1:26 PM EDT ----- Normal mammogram. Repeat in 1 yr

## 2020-04-14 ENCOUNTER — Other Ambulatory Visit: Payer: Self-pay | Admitting: Orthopedic Surgery

## 2020-04-14 DIAGNOSIS — M1711 Unilateral primary osteoarthritis, right knee: Secondary | ICD-10-CM

## 2020-04-24 ENCOUNTER — Other Ambulatory Visit: Payer: Self-pay

## 2020-04-24 ENCOUNTER — Ambulatory Visit
Admission: RE | Admit: 2020-04-24 | Discharge: 2020-04-24 | Disposition: A | Discharge status: Home / Self Care | Payer: Medicare HMO | Source: Ambulatory Visit | Attending: Orthopedic Surgery | Admitting: Orthopedic Surgery

## 2020-04-24 DIAGNOSIS — M1711 Unilateral primary osteoarthritis, right knee: Secondary | ICD-10-CM

## 2020-05-04 ENCOUNTER — Other Ambulatory Visit: Payer: Self-pay | Admitting: Orthopedic Surgery

## 2020-05-25 ENCOUNTER — Other Ambulatory Visit: Payer: Self-pay

## 2020-05-25 ENCOUNTER — Encounter
Admission: RE | Admit: 2020-05-25 | Discharge: 2020-05-25 | Disposition: A | Discharge status: Home / Self Care | Payer: Medicare HMO | Source: Ambulatory Visit | Attending: Orthopedic Surgery | Admitting: Orthopedic Surgery

## 2020-05-25 DIAGNOSIS — Z01818 Encounter for other preprocedural examination: Secondary | ICD-10-CM | POA: Insufficient documentation

## 2020-05-25 HISTORY — DX: Anal fissure, unspecified: K60.2

## 2020-05-25 HISTORY — DX: Parkinson's disease: G20

## 2020-05-25 HISTORY — DX: Unspecified hemorrhoids: K64.9

## 2020-05-25 HISTORY — DX: Gastro-esophageal reflux disease without esophagitis: K21.9

## 2020-05-25 HISTORY — DX: Acute myocardial infarction, unspecified: I21.9

## 2020-05-25 HISTORY — DX: Other seasonal allergic rhinitis: J30.2

## 2020-05-25 HISTORY — DX: Parkinson's disease without dyskinesia, without mention of fluctuations: G20.A1

## 2020-05-25 LAB — TYPE AND SCREEN
ABO/RH(D): O POS
Antibody Screen: NEGATIVE

## 2020-05-25 LAB — CBC WITH DIFFERENTIAL/PLATELET
Abs Immature Granulocytes: 0.01 10*3/uL (ref 0.00–0.07)
Basophils Absolute: 0.1 10*3/uL (ref 0.0–0.1)
Basophils Relative: 1 %
Eosinophils Absolute: 0.1 10*3/uL (ref 0.0–0.5)
Eosinophils Relative: 1 %
HCT: 38.8 % (ref 36.0–46.0)
Hemoglobin: 12.8 g/dL (ref 12.0–15.0)
Immature Granulocytes: 0 %
Lymphocytes Relative: 24 %
Lymphs Abs: 1.6 10*3/uL (ref 0.7–4.0)
MCH: 29 pg (ref 26.0–34.0)
MCHC: 33 g/dL (ref 30.0–36.0)
MCV: 88 fL (ref 80.0–100.0)
Monocytes Absolute: 0.4 10*3/uL (ref 0.1–1.0)
Monocytes Relative: 7 %
Neutro Abs: 4.3 10*3/uL (ref 1.7–7.7)
Neutrophils Relative %: 67 %
Platelets: 244 10*3/uL (ref 150–400)
RBC: 4.41 MIL/uL (ref 3.87–5.11)
RDW: 12.1 % (ref 11.5–15.5)
WBC: 6.5 10*3/uL (ref 4.0–10.5)
nRBC: 0 % (ref 0.0–0.2)

## 2020-05-25 LAB — COMPREHENSIVE METABOLIC PANEL
ALT: 12 U/L (ref 0–44)
AST: 25 U/L (ref 15–41)
Albumin: 4.1 g/dL (ref 3.5–5.0)
Alkaline Phosphatase: 89 U/L (ref 38–126)
Anion gap: 7 (ref 5–15)
BUN: 16 mg/dL (ref 8–23)
CO2: 30 mmol/L (ref 22–32)
Calcium: 9.7 mg/dL (ref 8.9–10.3)
Chloride: 102 mmol/L (ref 98–111)
Creatinine, Ser: 0.62 mg/dL (ref 0.44–1.00)
GFR calc Af Amer: >60 mL/min (ref >60)
GFR calc non Af Amer: >60 mL/min (ref >60)
Glucose, Bld: 81 mg/dL (ref 70–99)
Potassium: 4.1 mmol/L (ref 3.5–5.1)
Sodium: 139 mmol/L (ref 135–145)
Total Bilirubin: 0.8 mg/dL (ref 0.3–1.2)
Total Protein: 6.9 g/dL (ref 6.5–8.1)

## 2020-05-25 LAB — URINALYSIS, ROUTINE W REFLEX MICROSCOPIC
Bilirubin Urine: NEGATIVE
Glucose, UA: NEGATIVE mg/dL
Hgb urine dipstick: NEGATIVE
Ketones, ur: NEGATIVE mg/dL
Leukocytes,Ua: NEGATIVE
Nitrite: NEGATIVE
Protein, ur: NEGATIVE mg/dL
Specific Gravity, Urine: 1.015 (ref 1.005–1.030)
pH: 7 (ref 5.0–8.0)

## 2020-05-25 LAB — SURGICAL PCR SCREEN
MRSA, PCR: NEGATIVE
Staphylococcus aureus: NEGATIVE

## 2020-05-25 NOTE — Patient Instructions (Signed)
Your procedure is scheduled on: Tues 8/3 Report to Day Surgery. To find out your arrival time please call [phone] between 1PM - 3PM on Mon 8/2.  Remember: Instructions that are not followed completely may result in serious medical risk,  up to and including death, or upon the discretion of your surgeon and anesthesiologist your  surgery may need to be rescheduled.     _X__ 1. Do not eat food after midnight the night before your procedure.                 No gum chewing or hard candies. You may drink clear liquids up to 2 hours                 before you are scheduled to arrive for your surgery- DO not drink clear                 liquids within 2 hours of the start of your surgery.                 Clear Liquids include:  water, apple juice without pulp, clear Gatorade, G2 or                  Gatorade Zero (avoid Red/Purple/Blue), Black Coffee or Tea (Do not add                 anything to coffee or tea). _x____2.   Complete the carbohydrate drink provided to you, 2 hours before arrival.  __X__2.  On the morning of surgery brush your teeth with toothpaste and water, you                may rinse your mouth with mouthwash if you wish.  Do not swallow any toothpaste of mouthwash.     _X__ 3.  No Alcohol for 24 hours before or after surgery.   ___ 4.  Do Not Smoke or use e-cigarettes For 24 Hours Prior to Your Surgery.                 Do not use any chewable tobacco products for at least 6 hours prior to                 Surgery.  ___  5.  Do not use any recreational drugs (marijuana, cocaine, heroin, ecstacy, MDMA or other)                For at least one week prior to your surgery.  Combination of these drugs with anesthesia                May have life threatening results.  ____  6.  Bring all medications with you on the day of surgery if instructed.   _x___  7.  Notify your doctor if there is any change in your medical condition      (cold, fever,  infections).     Do not wear jewelry, make-up, hairpins, clips or nail polish. Do not wear lotions, powders, or perfumes. You may wear deodorant. Do not shave 48 hours prior to surgery. Do not bring valuables to the hospital.    [facility] is not responsible for any belongings or valuables.  Contacts, dentures or bridgework may not be worn into surgery. Leave your suitcase in the car. After surgery it may be brought to your room. For patients admitted to the hospital, discharge time is determined by your treatment team.   Patients discharged the  day of surgery will not be allowed to drive home.   Make arrangements for someone to be with you for the first 24 hours of your Same Day Discharge.    Please read over the following fact sheets that you were given:    _x___ Take these medicines the morning of surgery with A SIP OF WATER:    1. carbidopa-levodopa (SINEMET IR) 25-100 MG tablet  2. pramipexole (MIRAPEX) 0.5 MG tablet  3.   4.  5.  6.  ____ Fleet Enema (as directed)   __x__ Use CHG Soap (or wipes) as directed  ____ Use Benzoyl Peroxide Gel as instructed  ____ Use inhalers on the day of surgery  ____ Stop metformin 2 days prior to surgery    ____ Take 1/2 of usual insulin dose the night before surgery. No insulin the morning          of surgery.   __x__ Stop aspirin as per cardiology  __x__ Stop Anti-inflammatories meloxicam (MOBIC) 7.5 MG tablet, ibuprofen aleve or aspirin products on 7/27   X Stop supplements until after surgery. On 7/27 vitamin E 400 UNIT capsule,Ascorbic Acid (VITAMIN C) 1000 MG tablet, Coenzyme Q10 (COQ10) 200 MG CAPS,Omega-3 Fatty Acids (FISH OIL) 1000 MG CAPS  ____ Bring C-Pap to the hospital.

## 2020-06-02 ENCOUNTER — Other Ambulatory Visit: Payer: Self-pay

## 2020-06-02 ENCOUNTER — Other Ambulatory Visit
Admission: RE | Admit: 2020-06-02 | Discharge: 2020-06-02 | Disposition: A | Discharge status: Home / Self Care | Payer: Medicare HMO | Source: Ambulatory Visit | Attending: Orthopedic Surgery | Admitting: Orthopedic Surgery

## 2020-06-02 DIAGNOSIS — Z01812 Encounter for preprocedural laboratory examination: Secondary | ICD-10-CM | POA: Insufficient documentation

## 2020-06-02 DIAGNOSIS — Z20822 Contact with and (suspected) exposure to covid-19: Secondary | ICD-10-CM | POA: Insufficient documentation

## 2020-06-02 LAB — SARS CORONAVIRUS 2 (TAT 6-24 HRS): SARS Coronavirus 2: NEGATIVE

## 2020-06-05 MED ORDER — LACTATED RINGERS IV SOLN: INTRAVENOUS | Status: DC

## 2020-06-05 MED ORDER — FAMOTIDINE 20 MG PO TABS: 20.0000 mg | ORAL_TABLET | Freq: Once | ORAL | Status: AC

## 2020-06-05 MED ORDER — CHLORHEXIDINE GLUCONATE 0.12 % MT SOLN: 15.0000 mL | Freq: Once | OROMUCOSAL | Status: AC

## 2020-06-05 MED ORDER — ORAL CARE MOUTH RINSE: 15.0000 mL | Freq: Once | OROMUCOSAL | Status: AC

## 2020-06-05 MED ORDER — CEFAZOLIN SODIUM-DEXTROSE 2-4 GM/100ML-% IV SOLN
2.0000 g | INTRAVENOUS | Status: AC
  Administered 2020-06-06: 2 g via INTRAVENOUS

## 2020-06-05 NOTE — H&P (Signed)
Chief Complaint  Patient presents with  . Right Knee - Pre-op Exam   History of Present Illness: Felicia Snyder is a 74 y.o. female here for history and physical for right total knee arthroplasty with Dr. Hessie Knows on 06/06/2020. Her prior MRI is showing severe medial and patellofemoral osteoarthritis. She comes in to discuss possible knee arthroplasty. She feels her knee pain has worsened recently. She is limited with activity due to her knee pain, noting she previously walked 1-mile daily. She has pain if she is out doing a lot of shopping or other activities. The patient has to climb up 2-steps to get into her arm and is trying to move. She is careful going up the stairs one at a time. The patient localizes her pain to the medial and anterior knee with pain with referred anterior shin pain. She has difficulty standing from a chair needing to use the chair arms for assist.   I have reviewed past medical, surgical, social, and family history, and allergies as documented in the EMR.  Past Medical History: Past Medical History:  Diagnosis Date  . Allergic state  . Anal fissure 11/18/2014  . Chickenpox  . Colon cancer (CMS-HCC)  . GERD (gastroesophageal reflux disease)  . Hemorrhoids  . Hyperlipidemia  . Myocardial infarction (CMS-HCC)  . Osteoporosis, post-menopausal  . Parkinson's disease (CMS-HCC)   Past Surgical History: Past Surgical History:  Procedure Laterality Date  . Ankle surgery 2010  . CARDIAC CATHETERIZATION  . Colon cancer 1996  . COLONOSCOPY  . COLONOSCOPY 11/18/14  PHx of colon cancer/Normal colonoscopy/Repeat 14yrs/MUS  . CYSTECTOMY 3weeks ago  . Disc surgery 1982  . Disc surgery 1991  . excision of melanoma 2015  Shoulder   Past Family History: Family History  Problem Relation Age of Onset  . Breast cancer Mother  . High blood pressure (Hypertension) Mother  . Myocardial Infarction (Heart attack) Father  . Colon cancer Maternal Grandfather  . Colon cancer  Other  Sister-in-law  . Liver disease Neg Hx  . Ulcers Neg Hx  . Colon polyps Neg Hx   Medications: Current Outpatient Medications Ordered in Epic  Medication Sig Dispense Refill  . acyclovir (ZOVIRAX) 200 MG capsule Take by mouth.  Marland Kitchen ascorbic acid (VITAMIN C) 500 MG tablet Take 500 mg by mouth once daily.  Marland Kitchen aspirin 81 MG EC tablet Take 81 mg by mouth once daily.  Marland Kitchen atorvastatin (LIPITOR) 80 MG tablet Take 1 tablet (80 mg total) by mouth once daily 90 tablet 3  . calcium carbonate-vitamin D3 (CALTRATE 600+D) 600 mg(1,500mg ) -200 unit tablet Take by mouth.  . carbidopa-levodopa (SINEMET) 25-100 mg tablet Take by mouth.  . co-enzyme Q-10, ubiquinone, 30 mg capsule Take 300 mg by mouth.  . cyanocobalamin (VITAMIN B-12) 1000 MCG tablet Take by mouth.  . docosahexaenoic acid-epa 120-180 mg Cap Take by mouth  . loratadine (CLARITIN) 10 mg tablet Take by mouth.  . meloxicam (MOBIC) 7.5 MG tablet Take 1 tablet (7.5 mg total) by mouth once daily 60 tablet 1  . nitroGLYcerin (NITROSTAT) 0.4 MG SL tablet Place 1 tablet (0.4 mg total) under the tongue every 5 (five) minutes as needed for Chest pain. May take up to 3 doses. 25 tablet 1  . omega-3 fatty acids-fish oil 300-1,000 mg capsule Take by mouth  . peg 400-propylene glycol (SYSTANE ULTRA) 0.4-0.3 % drops Apply to eye  . polyethylene glycol (MIRALAX) powder Take 17 g by mouth once daily Mix in 4-8ounces of fluid prior  to taking.  . pramipexole 4.5 mg Tb24 TAKE 1 TABLET BY MOUTH DAILY AT 0600. 1  . vitamin E 400 UNIT capsule Take by mouth.   No current Epic-ordered facility-administered medications on file.   Allergies: No Known Allergies   Body mass index is 27.29 kg/m.  Review of Systems:  A comprehensive 14 point ROS was performed, reviewed, and the pertinent orthopaedic findings are documented in the HPI.  Physical Exam: General:  Well developed, well nourished, no apparent distress, normal affect, mildly antalgic gait with no  assistive devices  HEENT: Head normocephalic, atraumatic, PERRL.   Abdomen: Soft, non tender, non distended, Bowel sounds present.  Heart: Examination of the heart reveals regular, rate, and rhythm. There is no murmur noted on ascultation. There is a normal apical pulse.  Lungs: Lungs are clear to auscultation. There is no wheeze, rhonchi, or crackles. There is normal expansion of bilateral chest walls.   Vitals:  05/24/20 1011  BP: 124/82  Pulse: 64   Orthopaedic Examination: Antalgic gait  Right Knee  Extension 10 degrees Flexion 100 degrees Varus is passively correctable  Large Baker cyst Moderate effusion.  Crepitation laterally   Imaging Studies: MRI of the right knee severe medial and patellofemoral arthritis. On the AP view, at the lateral knee she has chondrocalcinosis. On the lateral view she has a small osteophyte formation at the tibia.   Assessment:  ICD-10-CM  1. Primary osteoarthritis of right knee M17.11  2. Examination of participant in clinical trial Z00.6    Plan: 16. 74 year old female with severe right knee osteoarthritis. Pain has been severe and debilitating and interfering with activities of daily living and quality of life. Risks, benefits, complications of a right total knee arthroplasty have been discussed with the patient. Patient has agreed and consented to the procedure with Dr. Hessie Knows on 06/06/2020    Electronically signed by Feliberto Gottron, [place] at 05/24/2020 10:31 AM EDT   Reviewed paper H+P, will be scanned into chart. No changes noted.

## 2020-06-06 ENCOUNTER — Other Ambulatory Visit: Payer: Self-pay

## 2020-06-06 ENCOUNTER — Inpatient Hospital Stay: Payer: Medicare HMO | Admitting: Anesthesiology

## 2020-06-06 ENCOUNTER — Encounter: Payer: Self-pay | Admitting: Orthopedic Surgery

## 2020-06-06 ENCOUNTER — Observation Stay: Payer: Medicare HMO

## 2020-06-06 ENCOUNTER — Encounter
Admission: RE | Disposition: A | Discharge status: Home Health | Payer: Self-pay | Source: Home / Self Care | Attending: Orthopedic Surgery

## 2020-06-06 ENCOUNTER — Inpatient Hospital Stay
Admission: RE | Admit: 2020-06-06 | Discharge: 2020-06-12 | DRG: 470 | Disposition: A | Discharge status: Home Health | Payer: Medicare HMO | Attending: Orthopedic Surgery | Admitting: Orthopedic Surgery

## 2020-06-06 DIAGNOSIS — I252 Old myocardial infarction: Secondary | ICD-10-CM | POA: Diagnosis not present

## 2020-06-06 DIAGNOSIS — Z8249 Family history of ischemic heart disease and other diseases of the circulatory system: Secondary | ICD-10-CM | POA: Diagnosis not present

## 2020-06-06 DIAGNOSIS — M1711 Unilateral primary osteoarthritis, right knee: Principal | ICD-10-CM | POA: Diagnosis present

## 2020-06-06 DIAGNOSIS — Z96651 Presence of right artificial knee joint: Secondary | ICD-10-CM

## 2020-06-06 DIAGNOSIS — Z85820 Personal history of malignant melanoma of skin: Secondary | ICD-10-CM | POA: Diagnosis not present

## 2020-06-06 DIAGNOSIS — Z79899 Other long term (current) drug therapy: Secondary | ICD-10-CM | POA: Diagnosis not present

## 2020-06-06 DIAGNOSIS — K649 Unspecified hemorrhoids: Secondary | ICD-10-CM | POA: Diagnosis present

## 2020-06-06 DIAGNOSIS — Z006 Encounter for examination for normal comparison and control in clinical research program: Secondary | ICD-10-CM

## 2020-06-06 DIAGNOSIS — Z7982 Long term (current) use of aspirin: Secondary | ICD-10-CM | POA: Diagnosis not present

## 2020-06-06 DIAGNOSIS — Z85038 Personal history of other malignant neoplasm of large intestine: Secondary | ICD-10-CM

## 2020-06-06 DIAGNOSIS — G20 Parkinson's disease: Secondary | ICD-10-CM | POA: Diagnosis present

## 2020-06-06 DIAGNOSIS — Z791 Long term (current) use of non-steroidal anti-inflammatories (NSAID): Secondary | ICD-10-CM | POA: Diagnosis not present

## 2020-06-06 DIAGNOSIS — Z20822 Contact with and (suspected) exposure to covid-19: Secondary | ICD-10-CM | POA: Diagnosis present

## 2020-06-06 DIAGNOSIS — E785 Hyperlipidemia, unspecified: Secondary | ICD-10-CM | POA: Diagnosis present

## 2020-06-06 DIAGNOSIS — Z803 Family history of malignant neoplasm of breast: Secondary | ICD-10-CM | POA: Diagnosis not present

## 2020-06-06 DIAGNOSIS — M810 Age-related osteoporosis without current pathological fracture: Secondary | ICD-10-CM | POA: Diagnosis present

## 2020-06-06 DIAGNOSIS — K219 Gastro-esophageal reflux disease without esophagitis: Secondary | ICD-10-CM | POA: Diagnosis present

## 2020-06-06 DIAGNOSIS — G8918 Other acute postprocedural pain: Secondary | ICD-10-CM

## 2020-06-06 DIAGNOSIS — J302 Other seasonal allergic rhinitis: Secondary | ICD-10-CM | POA: Diagnosis present

## 2020-06-06 DIAGNOSIS — Z800 Family history of malignant neoplasm of digestive organs: Secondary | ICD-10-CM

## 2020-06-06 HISTORY — PX: TOTAL KNEE ARTHROPLASTY: SHX125

## 2020-06-06 LAB — CBC
HCT: 34.8 % — ABNORMAL LOW (ref 36.0–46.0)
Hemoglobin: 11.4 g/dL — ABNORMAL LOW (ref 12.0–15.0)
MCH: 29.1 pg (ref 26.0–34.0)
MCHC: 32.8 g/dL (ref 30.0–36.0)
MCV: 88.8 fL (ref 80.0–100.0)
Platelets: 207 10*3/uL (ref 150–400)
RBC: 3.92 MIL/uL (ref 3.87–5.11)
RDW: 12.3 % (ref 11.5–15.5)
WBC: 9.6 10*3/uL (ref 4.0–10.5)
nRBC: 0 % (ref 0.0–0.2)

## 2020-06-06 LAB — CREATININE, SERUM
Creatinine, Ser: 0.5 mg/dL (ref 0.44–1.00)
GFR calc Af Amer: >60 mL/min (ref >60)
GFR calc non Af Amer: >60 mL/min (ref >60)

## 2020-06-06 LAB — ABO/RH: ABO/RH(D): O POS

## 2020-06-06 SURGERY — ARTHROPLASTY, KNEE, TOTAL
Anesthesia: Spinal | Site: Knee | Laterality: Right

## 2020-06-06 MED ORDER — ASPIRIN EC 81 MG PO TBEC
81.0000 mg | DELAYED_RELEASE_TABLET | Freq: Every day | ORAL | Status: DC
  Administered 2020-06-07 – 2020-06-12 (×6): 81 mg via ORAL
  Filled 2020-06-06 (×6): qty 1

## 2020-06-06 MED ORDER — LORATADINE 10 MG PO TABS: 10.0000 mg | ORAL_TABLET | Freq: Every day | ORAL | Status: DC | PRN

## 2020-06-06 MED ORDER — CEFAZOLIN SODIUM-DEXTROSE 2-4 GM/100ML-% IV SOLN
2.0000 g | Freq: Four times a day (QID) | INTRAVENOUS | Status: AC
  Administered 2020-06-06 (×2): 2 g via INTRAVENOUS
  Filled 2020-06-06 (×2): qty 100

## 2020-06-06 MED ORDER — ASCORBIC ACID 500 MG PO TABS
1000.0000 mg | ORAL_TABLET | Freq: Every day | ORAL | Status: DC
  Administered 2020-06-08 – 2020-06-11 (×4): 1000 mg via ORAL
  Filled 2020-06-06 (×5): qty 2

## 2020-06-06 MED ORDER — ATORVASTATIN CALCIUM 20 MG PO TABS
80.0000 mg | ORAL_TABLET | Freq: Every day | ORAL | Status: DC
  Administered 2020-06-06 – 2020-06-11 (×6): 80 mg via ORAL
  Filled 2020-06-06 (×6): qty 4

## 2020-06-06 MED ORDER — FENTANYL CITRATE (PF) 100 MCG/2ML IJ SOLN: 25.0000 ug | INTRAMUSCULAR | Status: DC | PRN

## 2020-06-06 MED ORDER — METOCLOPRAMIDE HCL 10 MG PO TABS: 5.0000 mg | ORAL_TABLET | Freq: Three times a day (TID) | ORAL | Status: DC | PRN

## 2020-06-06 MED ORDER — BUPIVACAINE-EPINEPHRINE (PF) 0.25% -1:200000 IJ SOLN
INTRAMUSCULAR | Status: DC | PRN
  Administered 2020-06-06: 30 mL

## 2020-06-06 MED ORDER — HYDROCODONE-ACETAMINOPHEN 5-325 MG PO TABS
1.0000 | ORAL_TABLET | ORAL | Status: DC | PRN
  Administered 2020-06-06: 1 via ORAL
  Administered 2020-06-06 – 2020-06-07 (×4): 2 via ORAL
  Administered 2020-06-12: 1 via ORAL
  Filled 2020-06-06: qty 2
  Filled 2020-06-06 (×2): qty 1
  Filled 2020-06-06 (×3): qty 2

## 2020-06-06 MED ORDER — ACETAMINOPHEN 325 MG PO TABS
325.0000 mg | ORAL_TABLET | Freq: Four times a day (QID) | ORAL | Status: DC | PRN
  Administered 2020-06-07 – 2020-06-12 (×8): 650 mg via ORAL
  Filled 2020-06-06 (×8): qty 2

## 2020-06-06 MED ORDER — MAGNESIUM CITRATE PO SOLN
1.0000 | Freq: Once | ORAL | Status: DC | PRN
  Filled 2020-06-06: qty 296

## 2020-06-06 MED ORDER — PROPOFOL 10 MG/ML IV BOLUS
INTRAVENOUS | Status: DC | PRN
  Administered 2020-06-06: 50 mg via INTRAVENOUS

## 2020-06-06 MED ORDER — MAGNESIUM HYDROXIDE 400 MG/5ML PO SUSP
30.0000 mL | Freq: Every day | ORAL | Status: DC | PRN
  Administered 2020-06-07 – 2020-06-09 (×2): 30 mL via ORAL
  Filled 2020-06-06 (×2): qty 30

## 2020-06-06 MED ORDER — ENOXAPARIN SODIUM 30 MG/0.3ML ~~LOC~~ SOLN
30.0000 mg | Freq: Two times a day (BID) | SUBCUTANEOUS | Status: DC
  Administered 2020-06-07 – 2020-06-12 (×12): 30 mg via SUBCUTANEOUS
  Filled 2020-06-06 (×11): qty 0.3

## 2020-06-06 MED ORDER — ONDANSETRON HCL 4 MG/2ML IJ SOLN
4.0000 mg | Freq: Four times a day (QID) | INTRAMUSCULAR | Status: DC | PRN
  Administered 2020-06-07: 4 mg via INTRAVENOUS
  Filled 2020-06-06: qty 2

## 2020-06-06 MED ORDER — METOCLOPRAMIDE HCL 5 MG/ML IJ SOLN: 5.0000 mg | Freq: Three times a day (TID) | INTRAMUSCULAR | Status: DC | PRN

## 2020-06-06 MED ORDER — PANTOPRAZOLE SODIUM 40 MG PO TBEC
40.0000 mg | DELAYED_RELEASE_TABLET | Freq: Every day | ORAL | Status: DC
  Administered 2020-06-07 – 2020-06-12 (×6): 40 mg via ORAL
  Filled 2020-06-06 (×6): qty 1

## 2020-06-06 MED ORDER — PROPOFOL 500 MG/50ML IV EMUL
INTRAVENOUS | Status: AC
  Filled 2020-06-06: qty 50

## 2020-06-06 MED ORDER — NEOMYCIN-POLYMYXIN B GU 40-200000 IR SOLN
Status: AC
  Filled 2020-06-06: qty 20

## 2020-06-06 MED ORDER — SODIUM CHLORIDE 0.9 % IV SOLN
INTRAVENOUS | Status: DC | PRN
  Administered 2020-06-06: 25 ug/min via INTRAVENOUS

## 2020-06-06 MED ORDER — VITAMIN E 180 MG (400 UNIT) PO CAPS
400.0000 [IU] | ORAL_CAPSULE | Freq: Every day | ORAL | Status: DC
  Administered 2020-06-08 – 2020-06-11 (×4): 400 [IU] via ORAL
  Filled 2020-06-06 (×7): qty 1

## 2020-06-06 MED ORDER — ZOLPIDEM TARTRATE 5 MG PO TABS: 5.0000 mg | ORAL_TABLET | Freq: Every evening | ORAL | Status: DC | PRN

## 2020-06-06 MED ORDER — BUPIVACAINE LIPOSOME 1.3 % IJ SUSP
INTRAMUSCULAR | Status: AC
  Filled 2020-06-06: qty 20

## 2020-06-06 MED ORDER — HYDROCODONE-ACETAMINOPHEN 7.5-325 MG PO TABS
1.0000 | ORAL_TABLET | ORAL | Status: DC | PRN
  Administered 2020-06-07: 2 via ORAL
  Administered 2020-06-10 – 2020-06-11 (×4): 1 via ORAL
  Filled 2020-06-06: qty 1
  Filled 2020-06-06: qty 2
  Filled 2020-06-06 (×3): qty 1

## 2020-06-06 MED ORDER — CHLORHEXIDINE GLUCONATE 0.12 % MT SOLN
OROMUCOSAL | Status: AC
  Administered 2020-06-06: 15 mL via OROMUCOSAL
  Filled 2020-06-06: qty 15

## 2020-06-06 MED ORDER — MENTHOL 3 MG MT LOZG
1.0000 | LOZENGE | OROMUCOSAL | Status: DC | PRN
  Filled 2020-06-06: qty 9

## 2020-06-06 MED ORDER — CARBIDOPA-LEVODOPA 25-100 MG PO TABS
1.0000 | ORAL_TABLET | Freq: Two times a day (BID) | ORAL | Status: DC
  Filled 2020-06-06: qty 1

## 2020-06-06 MED ORDER — BUPIVACAINE HCL (PF) 0.5 % IJ SOLN
INTRAMUSCULAR | Status: DC | PRN
  Administered 2020-06-06: 2.8 mL

## 2020-06-06 MED ORDER — POLYVINYL ALCOHOL 1.4 % OP SOLN
1.0000 [drp] | Freq: Three times a day (TID) | OPHTHALMIC | Status: DC | PRN
  Filled 2020-06-06: qty 15

## 2020-06-06 MED ORDER — MORPHINE SULFATE (PF) 10 MG/ML IV SOLN
INTRAVENOUS | Status: AC
  Filled 2020-06-06: qty 1

## 2020-06-06 MED ORDER — BUPIVACAINE-EPINEPHRINE (PF) 0.25% -1:200000 IJ SOLN
INTRAMUSCULAR | Status: AC
  Filled 2020-06-06: qty 30

## 2020-06-06 MED ORDER — ALUM & MAG HYDROXIDE-SIMETH 200-200-20 MG/5ML PO SUSP: 30.0000 mL | ORAL | Status: DC | PRN

## 2020-06-06 MED ORDER — METHOCARBAMOL 500 MG PO TABS
500.0000 mg | ORAL_TABLET | Freq: Four times a day (QID) | ORAL | Status: DC | PRN
  Administered 2020-06-09: 500 mg via ORAL
  Filled 2020-06-06: qty 1

## 2020-06-06 MED ORDER — NEOMYCIN-POLYMYXIN B GU 40-200000 IR SOLN
Status: DC | PRN
  Administered 2020-06-06: 12 mL

## 2020-06-06 MED ORDER — SODIUM CHLORIDE 0.9 % IV SOLN
INTRAVENOUS | Status: DC | PRN
  Administered 2020-06-06: 60 mL

## 2020-06-06 MED ORDER — PRAMIPEXOLE DIHYDROCHLORIDE 0.25 MG PO TABS
0.5000 mg | ORAL_TABLET | Freq: Two times a day (BID) | ORAL | Status: DC
  Administered 2020-06-06 – 2020-06-12 (×12): 0.5 mg via ORAL
  Filled 2020-06-06 (×14): qty 2

## 2020-06-06 MED ORDER — SODIUM CHLORIDE FLUSH 0.9 % IV SOLN
INTRAVENOUS | Status: AC
  Filled 2020-06-06: qty 40

## 2020-06-06 MED ORDER — NITROGLYCERIN 0.4 MG SL SUBL: 0.4000 mg | SUBLINGUAL_TABLET | SUBLINGUAL | Status: DC | PRN

## 2020-06-06 MED ORDER — FAMOTIDINE 20 MG PO TABS
ORAL_TABLET | ORAL | Status: AC
  Administered 2020-06-06: 20 mg via ORAL
  Filled 2020-06-06: qty 1

## 2020-06-06 MED ORDER — MORPHINE SULFATE (PF) 10 MG/ML IV SOLN
INTRAVENOUS | Status: DC | PRN
  Administered 2020-06-06: 1 mL via TOPICAL

## 2020-06-06 MED ORDER — PRAMIPEXOLE DIHYDROCHLORIDE 1 MG PO TABS
1.0000 mg | ORAL_TABLET | Freq: Two times a day (BID) | ORAL | Status: DC
  Administered 2020-06-06 – 2020-06-12 (×12): 1 mg via ORAL
  Filled 2020-06-06 (×15): qty 1

## 2020-06-06 MED ORDER — CHLORHEXIDINE GLUCONATE CLOTH 2 % EX PADS
6.0000 | MEDICATED_PAD | Freq: Every day | CUTANEOUS | Status: DC
  Administered 2020-06-08 – 2020-06-09 (×2): 6 via TOPICAL

## 2020-06-06 MED ORDER — PHENOL 1.4 % MT LIQD
1.0000 | OROMUCOSAL | Status: DC | PRN
  Filled 2020-06-06: qty 177

## 2020-06-06 MED ORDER — BISACODYL 10 MG RE SUPP
10.0000 mg | Freq: Every day | RECTAL | Status: DC | PRN
  Administered 2020-06-10: 10 mg via RECTAL
  Filled 2020-06-06: qty 1

## 2020-06-06 MED ORDER — CARBIDOPA-LEVODOPA 25-100 MG PO TABS
1.0000 | ORAL_TABLET | Freq: Two times a day (BID) | ORAL | Status: DC
  Administered 2020-06-06 – 2020-06-12 (×12): 1 via ORAL
  Filled 2020-06-06 (×13): qty 1

## 2020-06-06 MED ORDER — TRAMADOL HCL 50 MG PO TABS
50.0000 mg | ORAL_TABLET | Freq: Four times a day (QID) | ORAL | Status: DC
  Administered 2020-06-06 – 2020-06-11 (×17): 50 mg via ORAL
  Filled 2020-06-06 (×17): qty 1

## 2020-06-06 MED ORDER — ONDANSETRON HCL 4 MG PO TABS
4.0000 mg | ORAL_TABLET | Freq: Four times a day (QID) | ORAL | Status: DC | PRN
  Administered 2020-06-08 – 2020-06-09 (×2): 4 mg via ORAL
  Filled 2020-06-06 (×4): qty 1

## 2020-06-06 MED ORDER — COQ10 200 MG PO CAPS: 200.0000 mg | ORAL_CAPSULE | Freq: Every day | ORAL | Status: DC

## 2020-06-06 MED ORDER — VITAMIN B-12 1000 MCG PO TABS
1000.0000 ug | ORAL_TABLET | Freq: Every day | ORAL | Status: DC
  Administered 2020-06-08 – 2020-06-11 (×4): 1000 ug via ORAL
  Filled 2020-06-06 (×5): qty 1

## 2020-06-06 MED ORDER — MORPHINE SULFATE (PF) 2 MG/ML IV SOLN: 0.5000 mg | INTRAVENOUS | Status: DC | PRN

## 2020-06-06 MED ORDER — ZINC SULFATE 220 (50 ZN) MG PO CAPS
220.0000 mg | ORAL_CAPSULE | Freq: Every day | ORAL | Status: DC
  Administered 2020-06-08 – 2020-06-11 (×4): 220 mg via ORAL
  Filled 2020-06-06 (×6): qty 1

## 2020-06-06 MED ORDER — METHOCARBAMOL 1000 MG/10ML IJ SOLN
500.0000 mg | Freq: Four times a day (QID) | INTRAVENOUS | Status: DC | PRN
  Filled 2020-06-06: qty 5

## 2020-06-06 MED ORDER — KETOROLAC TROMETHAMINE 30 MG/ML IJ SOLN
INTRAMUSCULAR | Status: DC | PRN
  Administered 2020-06-06: 30 mg

## 2020-06-06 MED ORDER — ACYCLOVIR 200 MG PO CAPS
200.0000 mg | ORAL_CAPSULE | Freq: Every day | ORAL | Status: DC | PRN
  Filled 2020-06-06: qty 1

## 2020-06-06 MED ORDER — ONDANSETRON HCL 4 MG/2ML IJ SOLN
INTRAMUSCULAR | Status: DC | PRN
  Administered 2020-06-06: 4 mg via INTRAVENOUS

## 2020-06-06 MED ORDER — CEFAZOLIN SODIUM-DEXTROSE 2-4 GM/100ML-% IV SOLN
INTRAVENOUS | Status: AC
  Filled 2020-06-06: qty 100

## 2020-06-06 MED ORDER — DOCUSATE SODIUM 100 MG PO CAPS
100.0000 mg | ORAL_CAPSULE | Freq: Two times a day (BID) | ORAL | Status: DC
  Administered 2020-06-06 – 2020-06-12 (×12): 100 mg via ORAL
  Filled 2020-06-06 (×12): qty 1

## 2020-06-06 MED ORDER — MIDAZOLAM HCL 2 MG/2ML IJ SOLN
INTRAMUSCULAR | Status: AC
  Filled 2020-06-06: qty 2

## 2020-06-06 MED ORDER — MIDAZOLAM HCL 5 MG/5ML IJ SOLN
INTRAMUSCULAR | Status: DC | PRN
  Administered 2020-06-06 (×2): 1 mg via INTRAVENOUS

## 2020-06-06 MED ORDER — ONDANSETRON HCL 4 MG/2ML IJ SOLN: 4.0000 mg | Freq: Once | INTRAMUSCULAR | Status: DC | PRN

## 2020-06-06 MED ORDER — PROPOFOL 500 MG/50ML IV EMUL
INTRAVENOUS | Status: DC | PRN
  Administered 2020-06-06: 75 ug/kg/min via INTRAVENOUS

## 2020-06-06 MED ORDER — SODIUM CHLORIDE 0.9 % IV SOLN: INTRAVENOUS | Status: DC

## 2020-06-06 SURGICAL SUPPLY — 75 items
APL PRP STRL LF DISP 70% ISPRP (MISCELLANEOUS) ×2
BLADE SAGITTAL 25.0X1.19X90 (BLADE) ×2 IMPLANT
BLADE SAGITTAL AGGR TOOTH XLG (BLADE) ×2 IMPLANT
BLOCK CUTTING FEMUR 3 RT MED (MISCELLANEOUS) ×1 IMPLANT
BLOCK CUTTING TIBIAL 3 RT (MISCELLANEOUS) ×1 IMPLANT
BLOCK CUTTING TIBIAL 4 RT MIS (MISCELLANEOUS) ×1 IMPLANT
BNDG ELASTIC 6X5.8 VLCR STR LF (GAUZE/BANDAGES/DRESSINGS) ×2 IMPLANT
CANISTER SUCT 1200ML W/VALVE (MISCELLANEOUS) ×2 IMPLANT
CANISTER SUCT 3000ML PPV (MISCELLANEOUS) ×4 IMPLANT
CANISTER WOUND CARE 500ML ATS (WOUND CARE) ×2 IMPLANT
CEMENT FEMORAL COMP SZ3 RT (Femur) ×1 IMPLANT
CEMENT HV SMART SET (Cement) ×4 IMPLANT
CHLORAPREP W/TINT 26 (MISCELLANEOUS) ×4 IMPLANT
COOLER POLAR GLACIER W/PUMP (MISCELLANEOUS) ×2 IMPLANT
COVER WAND RF STERILE (DRAPES) ×2 IMPLANT
CUFF TOURN SGL QUICK 24 (TOURNIQUET CUFF)
CUFF TOURN SGL QUICK 30 (TOURNIQUET CUFF)
CUFF TRNQT CYL 24X4X16.5-23 (TOURNIQUET CUFF) IMPLANT
CUFF TRNQT CYL 30X4X21-28X (TOURNIQUET CUFF) IMPLANT
DRAPE 3/4 80X56 (DRAPES) ×4 IMPLANT
ELECT CAUTERY BLADE 6.4 (BLADE) ×2 IMPLANT
ELECT REM PT RETURN 9FT ADLT (ELECTROSURGICAL) ×2
ELECTRODE REM PT RTRN 9FT ADLT (ELECTROSURGICAL) ×1 IMPLANT
FEMUR BONE MODEL (MISCELLANEOUS) ×1 IMPLANT
GAUZE SPONGE 4X4 12PLY STRL (GAUZE/BANDAGES/DRESSINGS) ×2 IMPLANT
GAUZE XEROFORM 1X8 LF (GAUZE/BANDAGES/DRESSINGS) ×2 IMPLANT
GLOVE BIOGEL PI IND STRL 9 (GLOVE) ×1 IMPLANT
GLOVE BIOGEL PI INDICATOR 9 (GLOVE) ×1
GLOVE INDICATOR 8.0 STRL GRN (GLOVE) ×2 IMPLANT
GLOVE SURG ORTHO 8.0 STRL STRW (GLOVE) ×2 IMPLANT
GLOVE SURG SYN 9.0  PF PI (GLOVE) ×2
GLOVE SURG SYN 9.0 PF PI (GLOVE) ×1 IMPLANT
GOWN SRG 2XL LVL 4 RGLN SLV (GOWNS) ×1 IMPLANT
GOWN STRL NON-REIN 2XL LVL4 (GOWNS) ×2
GOWN STRL REUS W/ TWL LRG LVL3 (GOWN DISPOSABLE) ×1 IMPLANT
GOWN STRL REUS W/ TWL XL LVL3 (GOWN DISPOSABLE) ×1 IMPLANT
GOWN STRL REUS W/TWL LRG LVL3 (GOWN DISPOSABLE) ×2
GOWN STRL REUS W/TWL XL LVL3 (GOWN DISPOSABLE) ×2
HOLDER FOLEY CATH W/STRAP (MISCELLANEOUS) ×2 IMPLANT
HOOD PEEL AWAY FLYTE STAYCOOL (MISCELLANEOUS) ×4 IMPLANT
INSERT TIBAIL SZ3 HT12 FLEX (Insert) ×1 IMPLANT
KIT PREVENA INCISION MGT20CM45 (CANNISTER) ×2 IMPLANT
KIT TURNOVER KIT A (KITS) ×2 IMPLANT
NDL SAFETY ECLIPSE 18X1.5 (NEEDLE) ×1 IMPLANT
NDL SPNL 18GX3.5 QUINCKE PK (NEEDLE) ×1 IMPLANT
NDL SPNL 20GX3.5 QUINCKE YW (NEEDLE) ×1 IMPLANT
NEEDLE HYPO 18GX1.5 SHARP (NEEDLE) ×2
NEEDLE SPNL 18GX3.5 QUINCKE PK (NEEDLE) ×2 IMPLANT
NEEDLE SPNL 20GX3.5 QUINCKE YW (NEEDLE) ×2 IMPLANT
NS IRRIG 1000ML POUR BTL (IV SOLUTION) ×2 IMPLANT
PACK TOTAL KNEE (MISCELLANEOUS) ×2 IMPLANT
PAD WRAPON POLAR KNEE (MISCELLANEOUS) ×1 IMPLANT
PATELLA RESURFACING MEDACTA 02 (Bone Implant) ×1 IMPLANT
PENCIL SMOKE EVACUATOR COATED (MISCELLANEOUS) ×2 IMPLANT
PULSAVAC PLUS IRRIG FAN TIP (DISPOSABLE) ×2
SCALPEL PROTECTED #10 DISP (BLADE) ×4 IMPLANT
SOL .9 NS 3000ML IRR  AL (IV SOLUTION) ×2
SOL .9 NS 3000ML IRR AL (IV SOLUTION) ×1
SOL .9 NS 3000ML IRR UROMATIC (IV SOLUTION) ×1 IMPLANT
STAPLER SKIN PROX 35W (STAPLE) ×2 IMPLANT
STEM EXTENSION 11MMX30MM (Stem) ×1 IMPLANT
SUCTION FRAZIER HANDLE 10FR (MISCELLANEOUS) ×2
SUCTION TUBE FRAZIER 10FR DISP (MISCELLANEOUS) ×1 IMPLANT
SUT DVC 2 QUILL PDO  T11 36X36 (SUTURE) ×2
SUT DVC 2 QUILL PDO T11 36X36 (SUTURE) ×1 IMPLANT
SUT ETHIBOND 2 V 37 (SUTURE) IMPLANT
SUT V-LOC 90 ABS DVC 3-0 CL (SUTURE) ×2 IMPLANT
SYR 20ML LL LF (SYRINGE) ×2 IMPLANT
SYR 50ML LL SCALE MARK (SYRINGE) ×4 IMPLANT
TIP FAN IRRIG PULSAVAC PLUS (DISPOSABLE) ×1 IMPLANT
TOWEL OR 17X26 4PK STRL BLUE (TOWEL DISPOSABLE) ×2 IMPLANT
TOWER CARTRIDGE SMART MIX (DISPOSABLE) ×2 IMPLANT
TRAY FOLEY MTR SLVR 16FR STAT (SET/KITS/TRAYS/PACK) ×2 IMPLANT
TRAY TIB FX RT SZ 3 (Joint) ×1 IMPLANT
WRAPON POLAR PAD KNEE (MISCELLANEOUS) ×2

## 2020-06-06 NOTE — Op Note (Signed)
Date June 06, 2020  Time 10:30 AM   PATIENT:   Felicia Snyder   PRE-OPERATIVE DIAGNOSIS:  Primary localized osteoarthritis of right knee knee   POST-OPERATIVE DIAGNOSIS:  Same   PROCEDURE:  Procedure(s): right TOTAL KNEE ARTHROPLASTY   SURGEON: Laurene Footman, MD   ASSISTANTS: Talmage Coin RNFA   ANESTHESIA:   spinal   EBL:     BLOOD ADMINISTERED:none   DRAINS: Incisional wound VAC    LOCAL MEDICATIONS USED:  MARCAINE    and OTHER morphine Toradol and Exparel   SPECIMEN:  No Specimen   DISPOSITION OF SPECIMEN:  N/A   COUNTS:  YES   TOURNIQUET:   59 minutes at 300 mm Hg   IMPLANTS: Medacta  GMK sphere system with  3 right femur, 3 right tibia with short stem and  12 mm insert.  Size  2 patella, all components cemented.   DICTATION: Viviann Spare Dictation   patient was brought to the operating room and spinal anesthesia was obtained.  After prepping and draping the  right leg in sterile fashion, and after patient identification and timeout procedures were completed, tourniquet was raised  and midline skin incision was made followed by medial parapatellar arthrotomy with  severe medial compartment osteoarthritis, severe patellofemoral arthritis and  mild lateral compartment arthritis, partial synovectomy was also carried out.   The ACL and PCL and fat pad were excised along with anterior horns of the meniscus. The proximal tibia cutting guide from  the [facility] system was applied and the proximal tibia cut carried out.  The distal femoral cut was carried out in a similar fashion     The  3 femoral cutting guide applied with anterior posterior and chamfer cuts made.  The posterior horns of the menisci were removed at this point.   Injection of the above medication was carried out after the femoral and tibial cuts were carried out.  The  3 baseplate trial was placed pinned into position and proximal tibial preparation carried out with drilling hand reaming and the keel punch followed by  placement of the  3 femur and sizing the tibial insert size   12 millimeter gave the best fit with stability and full extension.  The distal femoral drill holes were made in the notch cut for the trochlear groove was then carried out with trials were then removed the patella was cut using the patellar cutting guide and it sized to a size  2 after drill holes have been made  The knee was irrigated with pulsatile lavage and the bony surfaces dried the tibial component was cemented into place first.  Excess cement was removed and the polyethylene insert placed with a torque screw placed with a torque screwdriver tightened.  The distal femoral component was placed and the knee was held in extension as the patellar button was clamped into place.  After the cement was set, excess cement was removed and the knee was again irrigated thoroughly thoroughly irrigated.  The tourniquet was let down and hemostasis checked with electrocautery. The arthrotomy was repaired with a heavy Quill suture,  followed by 3-0 V lock subcuticular closure, skin staples followed by incisional wound VAC and Polar Care.Marland Kitchen   PLAN OF CARE: Admit for overnight observation   PATIENT DISPOSITION:  PACU - hemodynamically stable.

## 2020-06-06 NOTE — Transfer of Care (Signed)
Immediate Anesthesia Transfer of Care Note  Patient: Felicia Snyder  Procedure(s) Performed: TOTAL KNEE ARTHROPLASTY (Right Knee)  Patient Location: PACU  Anesthesia Type:Spinal  Level of Consciousness: awake, alert  and oriented  Airway & Oxygen Therapy: Patient Spontanous Breathing and Patient connected to face mask oxygen  Post-op Assessment: Report given to RN and Post -op Vital signs reviewed and stable  Post vital signs: Reviewed and stable  Last Vitals:  Vitals Value Taken Time  BP    Temp    Pulse 90 06/06/20 1038  Resp 17 06/06/20 1038  SpO2 99 % 06/06/20 1038  Vitals shown include unvalidated device data.  Last Pain:  Vitals:   06/06/20 4315  TempSrc: Tympanic  PainSc: 0-No pain         Complications: No complications documented.

## 2020-06-06 NOTE — Progress Notes (Signed)
PHARMACIST - PHYSICIAN ORDER COMMUNICATION  CONCERNING: P&T Medication Policy on Herbal Medications  DESCRIPTION:  This patient's order for:  CoQ10  has been noted.  This product(s) is classified as an "herbal" or natural product. Due to a lack of definitive safety studies or FDA approval, nonstandard manufacturing practices, plus the potential risk of unknown drug-drug interactions while on inpatient medications, the Pharmacy and Therapeutics Committee does not permit the use of "herbal" or natural products of this type within [facility].   ACTION TAKEN: The pharmacy department is unable to verify this order at this time. Please reevaluate patient's clinical condition at discharge and address if the herbal or natural product(s) should be resumed at that time.  Paulina Fusi, PharmD, BCPS 06/06/2020 1:02 PM

## 2020-06-06 NOTE — Progress Notes (Signed)
   06/06/20 0740  Clinical Encounter Type  Visited With Patient  Visit Type Initial  Referral From Chaplain  Consult/Referral To Chaplain  Chaplain visited with patient on 06/06/20 before she went for her procedure. Chaplain wished patient well. While in the unit a nurse explained that patient may benefit from a visit because her surgery is being delayed. Chaplain told nurse that she had seen patient,but that she would go back in to talk with her. When chaplain went back in to see patient, patient asked her to call her husband to let him know about the delay.   Chaplain called patient's husband and told him that his wife's surgery is being delayed. He said thank you and asked how long will his wife's surgery be. After checking with the nursing staff, chaplain told him his wife's surgery will be approximately 2 hrs.

## 2020-06-06 NOTE — Anesthesia Procedure Notes (Signed)
Date/Time: 06/06/2020 8:54 AM Performed by: Nelda Marseille, CRNA Pre-anesthesia Checklist: Patient identified, Emergency Drugs available, Suction available, Patient being monitored and Timeout performed Oxygen Delivery Method: Simple face mask

## 2020-06-06 NOTE — Anesthesia Preprocedure Evaluation (Signed)
Anesthesia Evaluation  Patient identified by MRN, date of birth, ID band Patient awake    Reviewed: Allergy & Precautions, H&P , NPO status , Patient's Chart, lab work & pertinent test results, reviewed documented beta blocker date and time   History of Anesthesia Complications Negative for: history of anesthetic complications  Airway Mallampati: III  TM Distance: >3 FB Neck ROM: full    Dental  (+) Caps, Dental Advidsory Given, Teeth Intact Bridge on top left:   Pulmonary neg pulmonary ROS, former smoker,    Pulmonary exam normal breath sounds clear to auscultation       Cardiovascular Exercise Tolerance: Good (-) hypertension(-) angina+ CAD, + Past MI and + Cardiac Stents  (-) CABG Normal cardiovascular exam(-) dysrhythmias (-) Valvular Problems/Murmurs Rhythm:regular Rate:Normal     Neuro/Psych neg Seizures Parkinson's Disease negative psych ROS   GI/Hepatic Neg liver ROS, GERD  ,  Endo/Other  negative endocrine ROS  Renal/GU negative Renal ROS  negative genitourinary   Musculoskeletal   Abdominal   Peds  Hematology negative hematology ROS (+)   Anesthesia Other Findings Past Medical History: No date: Anal fissure 1996: Cancer ([place])     Comment:  colon cancer 2015: Cancer ([place])     Comment:  melanoma/ right shoulder No date: Cataract No date: GERD (gastroesophageal reflux disease) No date: Hemorrhoids No date: Myocardial infarction ([place]) No date: Parkinson disease ([place]) No date: Premature osteoporosis No date: Seasonal allergies   Reproductive/Obstetrics negative OB ROS                             Anesthesia Physical Anesthesia Plan  ASA: II  Anesthesia Plan: Spinal   Post-op Pain Management:    Induction:   PONV Risk Score and Plan: 2 and Propofol infusion and TIVA  Airway Management Planned: Natural Airway and Simple Face Mask  Additional Equipment:   Intra-op  Plan:   Post-operative Plan:   Informed Consent: I have reviewed the patients History and Physical, chart, labs and discussed the procedure including the risks, benefits and alternatives for the proposed anesthesia with the patient or authorized representative who has indicated his/her understanding and acceptance.     Dental Advisory Given  Plan Discussed with: Anesthesiologist, CRNA and Surgeon  Anesthesia Plan Comments:         Anesthesia Quick Evaluation

## 2020-06-06 NOTE — Progress Notes (Signed)
PT Cancellation Note  Patient Details Name: Felicia Snyder MRN: [id number] DOB: 1946/07/01   Cancelled Treatment:    Reason Eval/Treat Not Completed: Other (comment) Pt checked on twice, with first attempt made and pt in too much pain.  Pt reported she received pain medicine 15 minutes prior to first attempt.  Second attempt was made an hour after pain medicine given, however pt still in too much pain to do therapy.  Nursing enter room during second attempt and was going to give pt more pain medicine.  Pt and husband told we would follow up in the AM.   Gwenlyn Saran, PT, DPT 06/06/20, 3:47 PM

## 2020-06-06 NOTE — Anesthesia Procedure Notes (Signed)
Spinal  Patient location during procedure: OR Start time: 06/06/2020 8:33 AM End time: 06/06/2020 8:39 AM Staffing Performed: resident/CRNA  Resident/CRNA: Nelda Marseille, CRNA Preanesthetic Checklist Completed: patient identified, IV checked, site marked, risks and benefits discussed, surgical consent, monitors and equipment checked, pre-op evaluation and timeout performed Spinal Block Patient position: sitting Prep: Betadine Patient monitoring: heart rate, continuous pulse ox, blood pressure and cardiac monitor Approach: midline Location: L3-4 Injection technique: single-shot Needle Needle type: Whitacre and Introducer  Needle gauge: 25 G Needle length: 9 cm Assessment Sensory level: T10 Additional Notes Negative paresthesia. Negative blood return. Positive free-flowing CSF. Expiration date of kit checked and confirmed. Patient tolerated procedure well, without complications.

## 2020-06-07 ENCOUNTER — Encounter: Payer: Self-pay | Admitting: Orthopedic Surgery

## 2020-06-07 LAB — CBC
HCT: 30.8 % — ABNORMAL LOW (ref 36.0–46.0)
Hemoglobin: 10.2 g/dL — ABNORMAL LOW (ref 12.0–15.0)
MCH: 29.2 pg (ref 26.0–34.0)
MCHC: 33.1 g/dL (ref 30.0–36.0)
MCV: 88.3 fL (ref 80.0–100.0)
Platelets: 185 10*3/uL (ref 150–400)
RBC: 3.49 MIL/uL — ABNORMAL LOW (ref 3.87–5.11)
RDW: 12.5 % (ref 11.5–15.5)
WBC: 9.2 10*3/uL (ref 4.0–10.5)
nRBC: 0 % (ref 0.0–0.2)

## 2020-06-07 LAB — BASIC METABOLIC PANEL
Anion gap: 5 (ref 5–15)
BUN: 15 mg/dL (ref 8–23)
CO2: 27 mmol/L (ref 22–32)
Calcium: 8.7 mg/dL — ABNORMAL LOW (ref 8.9–10.3)
Chloride: 104 mmol/L (ref 98–111)
Creatinine, Ser: 0.63 mg/dL (ref 0.44–1.00)
GFR calc Af Amer: >60 mL/min (ref >60)
GFR calc non Af Amer: >60 mL/min (ref >60)
Glucose, Bld: 120 mg/dL — ABNORMAL HIGH (ref 70–99)
Potassium: 4.1 mmol/L (ref 3.5–5.1)
Sodium: 136 mmol/L (ref 135–145)

## 2020-06-07 MED ORDER — TRAMADOL HCL 50 MG PO TABS: 50.0000 mg | ORAL_TABLET | Freq: Four times a day (QID) | ORAL | 0 refills | Status: DC

## 2020-06-07 MED ORDER — HYDROCODONE-ACETAMINOPHEN 5-325 MG PO TABS: 1.0000 | ORAL_TABLET | ORAL | 0 refills | Status: DC | PRN

## 2020-06-07 MED ORDER — ENOXAPARIN SODIUM 40 MG/0.4ML ~~LOC~~ SOLN: 40.0000 mg | SUBCUTANEOUS | 0 refills | Status: DC

## 2020-06-07 MED ORDER — METHOCARBAMOL 500 MG PO TABS: 500.0000 mg | ORAL_TABLET | Freq: Four times a day (QID) | ORAL | 0 refills | Status: DC | PRN

## 2020-06-07 NOTE — Anesthesia Postprocedure Evaluation (Signed)
Anesthesia Post Note  Patient: Felicia Snyder  Procedure(s) Performed: TOTAL KNEE ARTHROPLASTY (Right Knee)  Patient location during evaluation: Nursing Unit Anesthesia Type: Spinal Level of consciousness: awake, awake and alert, oriented and sedated Pain management: pain level controlled Vital Signs Assessment: post-procedure vital signs reviewed and stable Respiratory status: spontaneous breathing, nonlabored ventilation and respiratory function stable Cardiovascular status: stable Postop Assessment: no headache, no backache, adequate PO intake, no apparent nausea or vomiting and patient able to bend at knees Anesthetic complications: no   No complications documented.   Last Vitals:  Vitals:   06/07/20 0402 06/07/20 0804  BP: 126/63 124/66  Pulse: 83 83  Resp: 16 18  Temp: 36.7 C 36.8 C  SpO2: 91% 93%    Last Pain:  Vitals:   06/07/20 0835  TempSrc:   PainSc: 6                  Felicia Snyder,  Siona Coulston R

## 2020-06-07 NOTE — Discharge Instructions (Signed)
TOTAL KNEE REPLACEMENT POSTOPERATIVE DIRECTIONS  Knee Rehabilitation, Guidelines Following Surgery  Results after knee surgery are often greatly improved when you follow the exercise, range of motion and muscle strengthening exercises prescribed by your doctor. Safety measures are also important to protect the knee from further injury. Any time any of these exercises cause you to have increased pain or swelling in your knee joint, decrease the amount until you are comfortable again and slowly increase them. If you have problems or questions, call your caregiver or physical therapist for advice.   HOME CARE INSTRUCTIONS  Remove items at home which could result in a fall. This includes throw rugs or furniture in walking pathways.   ICE using the Polar Care unit to the affected knee every three hours for 30 minutes at a time and then as needed for pain and swelling.  Place a dry towel or pillow case over the knee before applying the Polar Care Unit.  Continue to use ice on the knee for pain and swelling from surgery. You may notice swelling that will progress down to the foot and ankle.  This is normal after surgery.  Elevate the leg when you are not up walking on it.    Continue to use the breathing machine which will help keep your temperature down.  It is common for your temperature to cycle up and down following surgery, especially at night when you are not up moving around and exerting yourself.  The breathing machine keeps your lungs expanded and your temperature down.  Do not place pillow under knee, focus on keeping the knee straight while resting  DIET You may resume your previous home diet once your are discharged from the hospital.  DRESSING / WOUND CARE / SHOWERING The wound VAC will remain on your knee for 5 to 7 days.  As the suction machine begins to BP regularly it will mean the battery is almost out.  Physical therapy can remove the wound VAC bandage and apply a dry dressing. You  need to keep your wound dry after being discharged home.  Just keep the incision dry and apply a dry gauze dressing on daily. Change the surgical dressing only if needed and reapply a dry dressing each time.  ACTIVITY Walk with your walker as instructed. Use walker as long as suggested by your caregivers. Avoid periods of inactivity such as sitting longer than an hour when not asleep. This helps prevent blood clots.  You may resume a sexual relationship in one month or when given the OK by your doctor.  You may return to work once you are cleared by your doctor.  Do not drive a car for 6 weeks or until released by you surgeon.  Do not drive while taking narcotics.  WEIGHT BEARING Weight bearing as tolerated with assist device (walker, cane, etc) as directed, use it as long as suggested by your surgeon or therapist, typically at least 4-6 weeks.  POSTOPERATIVE CONSTIPATION PROTOCOL Constipation - defined medically as fewer than three stools per week and severe constipation as less than one stool per week.  One of the most common issues patients have following surgery is constipation.  Even if you have a regular bowel pattern at home, your normal regimen is likely to be disrupted due to multiple reasons following surgery.  Combination of anesthesia, postoperative narcotics, change in appetite and fluid intake all can affect your bowels.  In order to avoid complications following surgery, here are some recommendations in order to  help you during your recovery period.  Colace (docusate) - Pick up an over-the-counter form of Colace or another stool softener and take twice a day as long as you are requiring postoperative pain medications.  Take with a full glass of water daily.  If you experience loose stools or diarrhea, hold the colace until you stool forms back up.  If your symptoms do not get better within 1 week or if they get worse, check with your doctor.  Dulcolax (bisacodyl) - Pick up  over-the-counter and take as directed by the product packaging as needed to assist with the movement of your bowels.  Take with a full glass of water.  Use this product as needed if not relieved by Colace only.   MiraLax (polyethylene glycol) - Pick up over-the-counter to have on hand.  MiraLax is a solution that will increase the amount of water in your bowels to assist with bowel movements.  Take as directed and can mix with a glass of water, juice, soda, coffee, or tea.  Take if you go more than two days without a movement. Do not use MiraLax more than once per day. Call your doctor if you are still constipated or irregular after using this medication for 7 days in a row.  If you continue to have problems with postoperative constipation, please contact the office for further assistance and recommendations.  If you experience "the worst abdominal pain ever" or develop nausea or vomiting, please contact the office immediatly for further recommendations for treatment.  ITCHING  If you experience itching with your medications, try taking only a single pain pill, or even half a pain pill at a time.  You can also use Benadryl over the counter for itching or also to help with sleep.   TED HOSE STOCKINGS Wear the elastic stockings on both legs for six weeks following surgery during the day but you may remove then at night for sleeping.  MEDICATIONS See your medication summary on the "After Visit Summary" that the nursing staff will review with you prior to discharge.  You may have some home medications which will be placed on hold until you complete the course of blood thinner medication.  It is important for you to complete the blood thinner medication as prescribed by your surgeon.  Continue your approved medications as instructed at time of discharge.  PRECAUTIONS If you experience chest pain or shortness of breath - call 911 immediately for transfer to the hospital emergency department.  If you  develop a fever greater that 101 F, purulent drainage from wound, increased redness or drainage from wound, foul odor from the wound/dressing, or calf pain - CONTACT YOUR SURGEON.                                                   FOLLOW-UP APPOINTMENTS Make sure you keep all of your appointments after your operation with your surgeon and caregivers. You should call the office at the above phone number and make an appointment for approximately two weeks after the date of your surgery or on the date instructed by your surgeon outlined in the "After Visit Summary".   RANGE OF MOTION AND STRENGTHENING EXERCISES  Rehabilitation of the knee is important following a knee injury or an operation. After just a few days of immobilization, the muscles of the thigh  which control the knee become weakened and shrink (atrophy). Knee exercises are designed to build up the tone and strength of the thigh muscles and to improve knee motion. Often times heat used for twenty to thirty minutes before working out will loosen up your tissues and help with improving the range of motion but do not use heat for the first two weeks following surgery. These exercises can be done on a training (exercise) mat, on the floor, on a table or on a bed. Use what ever works the best and is most comfortable for you Knee exercises include:  Leg Lifts - While your knee is still immobilized in a splint or cast, you can do straight leg raises. Lift the leg to 60 degrees, hold for 3 sec, and slowly lower the leg. Repeat 10-20 times 2-3 times daily. Perform this exercise against resistance later as your knee gets better.  Quad and Hamstring Sets - Tighten up the muscle on the front of the thigh (Quad) and hold for 5-10 sec. Repeat this 10-20 times hourly. Hamstring sets are done by pushing the foot backward against an object and holding for 5-10 sec. Repeat as with quad sets.   Leg Slides: Lying on your back, slowly slide your foot toward your  buttocks, bending your knee up off the floor (only go as far as is comfortable). Then slowly slide your foot back down until your leg is flat on the floor again.  Angel Wings: Lying on your back spread your legs to the side as far apart as you can without causing discomfort.  A rehabilitation program following serious knee injuries can speed recovery and prevent re-injury in the future due to weakened muscles. Contact your doctor or a physical therapist for more information on knee rehabilitation.   IF YOU ARE TRANSFERRED TO A SKILLED REHAB FACILITY If the patient is transferred to a skilled rehab facility following release from the hospital, a list of the current medications will be sent to the facility for the patient to continue.  When discharged from the skilled rehab facility, please have the facility set up the patient's [place] prior to being released. Also, the skilled facility will be responsible for providing the patient with their medications at time of release from the facility to include their pain medication, the muscle relaxants, and their blood thinner medication. If the patient is still at the rehab facility at time of the two week follow up appointment, the skilled rehab facility will also need to assist the patient in arranging follow up appointment in our office and any transportation needs.  MAKE SURE YOU:  Understand these instructions.  Get help right away if you are not doing well or get worse.    Pick up stool softner and laxative for home use following surgery while on pain medications. Do not submerge incision under water. Please use good hand washing techniques while changing dressing each day. May shower starting three days after surgery. Please use a clean towel to pat the incision dry following showers. Continue to use ice for pain and swelling after surgery. Do not use any lotions or creams on the incision until instructed by your surgeon.

## 2020-06-07 NOTE — NC FL2 (Signed)
[place] LEVEL OF CARE SCREENING TOOL     IDENTIFICATION  Patient Name: Felicia Snyder Birthdate: 07-30-1946 Sex: female Admission Date (Current Location): 06/06/2020  [place] and [place] Number:  Engineering geologist and Address:  [facility], [address], [place], [place] [address]      Provider Number: [id number]  Attending Physician Name and Address:  Hessie Knows, MD  Relative Name and Phone Number:  Juanda Crumble SPouse [phone]    Current Level of Care: Hospital Recommended Level of Care: [place] Prior Approval Number:    Date Approved/Denied:   PASRR Number: [id number]  Discharge Plan: SNF    Current Diagnoses: Patient Active Problem List   Diagnosis Date Noted  . Status post total knee replacement using cement, right 06/06/2020  . Left leg pain 11/10/2019  . CAD (coronary artery disease) 01/15/2019  . Hyperlipidemia, mixed 05/12/2017  . Allergic rhinitis 05/03/2016  . Herpes simplex 05/03/2016  . History of malignant melanoma 05/03/2016  . OP (osteoporosis) 05/03/2016  . History of MI (myocardial infarction) 10/02/2015  . Incomplete uterine prolapse 12/27/2014  . Chronic constipation 12/26/2014  . H/O malignant neoplasm of colon 10/13/2014  . Parkinson's disease ([place]) 05/04/2013    Orientation RESPIRATION BLADDER Height & Weight     Self, Time, Situation, Place  Normal Continent Weight: 72 kg Height:  5\' 4"  (162.6 cm)  BEHAVIORAL SYMPTOMS/MOOD NEUROLOGICAL BOWEL NUTRITION STATUS      Continent Diet (Regular)  AMBULATORY STATUS COMMUNICATION OF NEEDS Skin   Extensive Assist Verbally Surgical wounds                       Personal Care Assistance Level of Assistance  Bathing, Feeding, Dressing Bathing Assistance: Limited assistance Feeding assistance: Limited assistance Dressing Assistance: Limited assistance     Functional Limitations Info             SPECIAL CARE  FACTORS FREQUENCY  PT (By licensed PT), OT (By licensed OT)     PT Frequency: 5 times per week OT Frequency: 3 times per week            Contractures Contractures Info: Not present    Additional Factors Info  Code Status, Allergies Code Status Info: full code Allergies Info: NKDA           Current Medications (06/07/2020):  This is the current hospital active medication list Current Facility-Administered Medications  Medication Dose Route Frequency Provider Last Rate Last Admin  . 0.9 %  sodium chloride infusion   Intravenous Continuous Hessie Knows, MD   Stopped at 06/07/20 1338  . acetaminophen (TYLENOL) tablet 325-650 mg  325-650 mg Oral Q6H PRN Hessie Knows, MD   650 mg at 06/07/20 1116  . acyclovir (ZOVIRAX) 200 MG capsule 200 mg  200 mg Oral 5 X Daily PRN Hessie Knows, MD      . alum & mag hydroxide-simeth (MAALOX/MYLANTA) 200-200-20 MG/5ML suspension 30 mL  30 mL Oral Q4H PRN Hessie Knows, MD      . ascorbic acid (VITAMIN C) tablet 1,000 mg  1,000 mg Oral Q1200 Hessie Knows, MD      . aspirin EC tablet 81 mg  81 mg Oral Daily Hessie Knows, MD   81 mg at 06/07/20 4034  . atorvastatin (LIPITOR) tablet 80 mg  80 mg Oral QHS Hessie Knows, MD   80 mg at 06/06/20 2012  . bisacodyl (DULCOLAX) suppository 10 mg  10 mg Rectal  Daily PRN Hessie Knows, MD      . carbidopa-levodopa (SINEMET IR) 25-100 MG per tablet immediate release 1 tablet  1 tablet Oral BID Hessie Knows, MD   1 tablet at 06/07/20 0841  . Chlorhexidine Gluconate Cloth 2 % PADS 6 each  6 each Topical Daily Hessie Knows, MD      . docusate sodium (COLACE) capsule 100 mg  100 mg Oral BID Hessie Knows, MD   100 mg at 06/07/20 1275  . enoxaparin (LOVENOX) injection 30 mg  30 mg Subcutaneous Q12H Hessie Knows, MD   30 mg at 06/07/20 0842  . HYDROcodone-acetaminophen (NORCO) 7.5-325 MG per tablet 1-2 tablet  1-2 tablet Oral Q4H PRN Hessie Knows, MD   2 tablet at 06/07/20 0402  . HYDROcodone-acetaminophen  (NORCO/VICODIN) 5-325 MG per tablet 1-2 tablet  1-2 tablet Oral Q4H PRN Hessie Knows, MD   2 tablet at 06/07/20 1506  . loratadine (CLARITIN) tablet 10 mg  10 mg Oral Daily PRN Hessie Knows, MD      . magnesium citrate solution 1 Bottle  1 Bottle Oral Once PRN Hessie Knows, MD      . magnesium hydroxide (MILK OF MAGNESIA) suspension 30 mL  30 mL Oral Daily PRN Hessie Knows, MD   30 mL at 06/07/20 0938  . menthol-cetylpyridinium (CEPACOL) lozenge 3 mg  1 lozenge Oral PRN Hessie Knows, MD       Or  . phenol (CHLORASEPTIC) mouth spray 1 spray  1 spray Mouth/Throat PRN Hessie Knows, MD      . methocarbamol (ROBAXIN) tablet 500 mg  500 mg Oral Q6H PRN Hessie Knows, MD       Or  . methocarbamol (ROBAXIN) 500 mg in dextrose 5 % 50 mL IVPB  500 mg Intravenous Q6H PRN Hessie Knows, MD      . metoCLOPramide (REGLAN) tablet 5-10 mg  5-10 mg Oral Q8H PRN Hessie Knows, MD       Or  . metoCLOPramide (REGLAN) injection 5-10 mg  5-10 mg Intravenous Q8H PRN Hessie Knows, MD      . morphine 2 MG/ML injection 0.5-1 mg  0.5-1 mg Intravenous Q2H PRN Hessie Knows, MD      . nitroGLYCERIN (NITROSTAT) SL tablet 0.4 mg  0.4 mg Sublingual Q5 Min x 3 PRN Hessie Knows, MD      . ondansetron [facility]) tablet 4 mg  4 mg Oral Q6H PRN Hessie Knows, MD       Or  . ondansetron [facility]) injection 4 mg  4 mg Intravenous Q6H PRN Hessie Knows, MD   4 mg at 06/07/20 1217  . pantoprazole (PROTONIX) EC tablet 40 mg  40 mg Oral Daily Hessie Knows, MD   40 mg at 06/07/20 1700  . polyvinyl alcohol (LIQUIFILM TEARS) 1.4 % ophthalmic solution 1 drop  1 drop Both Eyes TID PRN Hessie Knows, MD      . pramipexole (MIRAPEX) tablet 0.5 mg  0.5 mg Oral BID Hessie Knows, MD   0.5 mg at 06/07/20 0841  . pramipexole (MIRAPEX) tablet 1 mg  1 mg Oral BID Hessie Knows, MD   1 mg at 06/07/20 1749  . traMADol (ULTRAM) tablet 50 mg  50 mg Oral Q6H Hessie Knows, MD   50 mg at 06/07/20 1116  . vitamin B-12 (CYANOCOBALAMIN) tablet 1,000  mcg  1,000 mcg Oral Q1200 Hessie Knows, MD      . vitamin E capsule 400 Units  400 Units Oral Q1200 Hessie Knows, MD      .  zinc sulfate capsule 220 mg  220 mg Oral Q1200 Hessie Knows, MD      . zolpidem [facility]) tablet 5 mg  5 mg Oral QHS PRN Hessie Knows, MD         Discharge Medications: Please see discharge summary for a list of discharge medications.  Relevant Imaging Results:  Relevant Lab Results:   Additional Information SS# [id number]  Su Hilt, RN

## 2020-06-07 NOTE — Progress Notes (Addendum)
   06/07/20 1800  Clinical Encounter Type  Visited With Patient and family together  Visit Type Follow-up  Referral From Chaplain  Consult/Referral To Chaplain  Chaplain stopped in to check on patient. Nurse was administering meds. Chaplain introduced herself to patient's husband and he thanked chaplain for calling him yesterday. Chaplain told him that he was welcome. Visit was brief and chaplain wished patient and husband well.

## 2020-06-07 NOTE — Evaluation (Signed)
Occupational Therapy Evaluation Patient Details Name: Felicia Snyder MRN: [id number] DOB: 1945-11-08 Today's Date: 06/07/2020    History of Present Illness Pt is 74 y.o. female s/p R TKA.  PMH includes: former speaker, CAD, angina, past MI, cardiac stents, GERD, cancer, and Parkinson's.   Clinical Impression   [name] was seen for OT evaluation this date, POD#1 from above surgery. Prior to hospital admission, pt was Independent c mobility and ADLs, pt endorses difficulty c LBD. Pt lives c husband in multi level home, able to live on main level. Pt presents to acute OT demonstrating impaired ADL performance and functional mobility 2/2 decreased activity tolerance, functional strength/ROM/balance deficits, and decreased LB access. Upon entry, pt seated EOB c PT, fatigued but agreeable to session. Pt currently requires MAX A doff B socks seated EOB. MIN A sit>sup - assist for RLE mgmt. MOD I for self-drinking at bed level. Pt and family instructed in polar care mgt, falls prevention strategies, home/routines modifications, DME/AE for LB bathing and dressing tasks, and compression stocking mgt. Pt would benefit from skilled OT to address noted impairments and functional limitations (see below for any additional details) in order to maximize safety and independence while minimizing falls risk and caregiver burden. Upon hospital discharge, recommend STR to maximize pt safety and return to PLOF.     Follow Up Recommendations  SNF    Equipment Recommendations   (TBD)    Recommendations for Other Services       Precautions / Restrictions Precautions Precautions: Knee Precaution Booklet Issued: No Restrictions Weight Bearing Restrictions: Yes RLE Weight Bearing: Weight bearing as tolerated      Mobility Bed Mobility Overal bed mobility: Needs Assistance Bed Mobility: Sit to Supine     Supine to sit: Min assist Sit to supine: Min assist   General bed mobility comments: Assist for  RLE  Transfers Overall transfer level: Needs assistance Equipment used: Rolling walker (2 wheeled) Transfers: Sit to/from Stand Sit to Stand: Mod assist         General transfer comment: Pt received EOB and returned to supine     Balance Overall balance assessment: Needs assistance Sitting-balance support: Single extremity supported;Feet supported Sitting balance-Leahy Scale: Fair     Standing balance support: Bilateral upper extremity supported Standing balance-Leahy Scale: Poor Standing balance comment: Pt unable to remain upright without modA initially.                           ADL either performed or assessed with clinical judgement   ADL Overall ADL's : Needs assistance/impaired         General ADL Comments: MAX A doff B socks seated EOB. MOD I for self-drinking at bed level      Pertinent Vitals/Pain Pain Assessment: 0-10 Pain Score: 7  Pain Location: R knee Pain Descriptors / Indicators: Aching;Discomfort Pain Intervention(s): Limited activity within patient's tolerance;Monitored during session;Patient requesting pain meds-RN notified     Hand Dominance Right   Extremity/Trunk Assessment Upper Extremity Assessment Upper Extremity Assessment: Overall WFL for tasks assessed   Lower Extremity Assessment Lower Extremity Assessment: RLE deficits/detail RLE Deficits / Details: Weakness noted in RLE from surgical procedure RLE: Unable to fully assess due to pain RLE Sensation: WNL       Communication Communication Communication: No difficulties   Cognition Arousal/Alertness: Awake/alert Behavior During Therapy: WFL for tasks assessed/performed Overall Cognitive Status: Within Functional Limits for tasks assessed      General  Comments       Exercises Exercises: Other exercises Other Exercises Other Exercises: Pt educated re: OT role, DME recs, d/c recs, importance of mobility for functional strengthening, falls prevention, pain mgmt,  ECS, polar care mgmt, adapted dressing  Other Exercises: LBD, self-drinking, sitting/standing balance/tolerance, bed mobility   Shoulder Instructions      Home Living Family/patient expects to be discharged to:: Private residence Living Arrangements: Spouse/significant other Available Help at Discharge: Family;Friend(s);Available 24 hours/day Type of Home: House Home Access: Stairs to enter [employer] of Steps: 3 Entrance Stairs-Rails: None Home Layout: Multi-level;Able to live on main level with bedroom/bathroom Alternate Level Stairs-Number of Steps: Everything necessary is on the main floor. Alternate Level Stairs-Rails: Right Bathroom Shower/Tub: Tub/shower unit (walk in shower under Architect )   Bathroom Toilet: Standard Bathroom Accessibility: Yes   Home Equipment: Walker - 2 wheels;Bedside commode          Prior Functioning/Environment Level of Independence: Independent              OT Problem List: Decreased range of motion;Decreased strength;Decreased activity tolerance;Impaired balance (sitting and/or standing);Decreased knowledge of use of DME or AE      OT Treatment/Interventions: Self-care/ADL training;Therapeutic exercise;Energy conservation;DME and/or AE instruction;Therapeutic activities;Patient/family education;Balance training    OT Goals(Current goals can be found in the care plan section) Acute Rehab OT Goals Patient Stated Goal: To be able to go home. OT Goal Formulation: With patient/family Time For Goal Achievement: 06/21/20 Potential to Achieve Goals: Good ADL Goals Pt Will Perform Grooming: with modified independence;sitting Pt Will Perform Lower Body Dressing: with mod assist;sit to/from stand (c LRAD PRN) Pt Will Transfer to Toilet: with modified independence;stand pivot transfer;bedside commode (c LRAD PRN)  OT Frequency: Min 1X/week   Barriers to D/C: Inaccessible home environment          Co-evaluation               AM-PAC OT "6 Clicks" Daily Activity     Outcome Measure Help from another person eating meals?: None Help from another person taking care of personal grooming?: None Help from another person toileting, which includes using toliet, bedpan, or urinal?: A Lot Help from another person bathing (including washing, rinsing, drying)?: A Lot Help from another person to put on and taking off regular upper body clothing?: A Little Help from another person to put on and taking off regular lower body clothing?: A Lot 6 Click Score: 17   End of Session Nurse Communication: Patient requests pain meds  Activity Tolerance: Patient limited by pain Patient left: in bed;with call bell/phone within reach;with bed alarm set;with family/visitor present  OT Visit Diagnosis: Unsteadiness on feet (R26.81);Other abnormalities of gait and mobility (R26.89)                Time: 1346-1406 OT Time Calculation (min): 20 min Charges:  OT General Charges $OT Visit: 1 Visit OT Evaluation $OT Eval Low Complexity: 1 Low OT Treatments $Self Care/Home Management : 8-22 mins  Dessie Coma, M.S. OTR/L  06/07/20, 2:43 PM  ascom [phone]

## 2020-06-07 NOTE — Progress Notes (Signed)
  Subjective: 1 Day Post-Op Procedure(s) (LRB): TOTAL KNEE ARTHROPLASTY (Right) Patient reports pain as mild.   Patient is well, and has had no acute complaints or problems Plan is to go Home after hospital stay. Negative for chest pain and shortness of breath Fever: no Gastrointestinal: Negative for nausea and vomiting  Objective: Vital signs in last 24 hours: Temp:  [96.9 F (36.1 C)-98.1 F (36.7 C)] 98.1 F (36.7 C) (08/04 0402) Pulse Rate:  [51-90] 83 (08/04 0402) Resp:  [14-23] 16 (08/04 0402) BP: (107-128)/(58-74) 126/63 (08/04 0402) SpO2:  [91 %-100 %] 91 % (08/04 0402)  Intake/Output from previous day:  Intake/Output Summary (Last 24 hours) at 06/07/2020 0700 Last data filed at 06/07/2020 0300 Gross per 24 hour  Intake 2278.65 ml  Output 1100 ml  Net 1178.65 ml    Intake/Output this shift: Total I/O In: 1188.7 [P.O.:120; I.V.:968.7; IV Piggyback:100] Out: 300 [Urine:300]  Labs: Recent Labs    06/06/20 1319 06/07/20 0548  HGB 11.4* 10.2*   Recent Labs    06/06/20 1319 06/07/20 0548  WBC 9.6 9.2  RBC 3.92 3.49*  HCT 34.8* 30.8*  PLT 207 185   Recent Labs    06/06/20 1319 06/07/20 0548  NA  --  136  K  --  4.1  CL  --  104  CO2  --  27  BUN  --  15  CREATININE 0.50 0.63  GLUCOSE  --  120*  CALCIUM  --  8.7*   No results for input(s): LABPT, INR in the last 72 hours.   EXAM General - Patient is Alert and Oriented Extremity - Neurovascular intact Sensation intact distally Dorsiflexion/Plantar flexion intact Compartment soft Dressing/Incision - clean, dry, with the wound VAC intact Motor Function - intact, moving foot and toes well on exam.   Past Medical History:  Diagnosis Date  . Anal fissure   . Cancer [facility]) 1996   colon cancer  . Cancer ([place]) 2015   melanoma/ right shoulder  . Cataract   . GERD (gastroesophageal reflux disease)   . Hemorrhoids   . Myocardial infarction ([place])   . Parkinson disease ([place])   . Premature  osteoporosis   . Seasonal allergies     Assessment/Plan: 1 Day Post-Op Procedure(s) (LRB): TOTAL KNEE ARTHROPLASTY (Right) Active Problems:   Status post total knee replacement using cement, right  Estimated body mass index is 27.25 kg/m as calculated from the following:   Height as of this encounter: 5\' 4"  (1.626 m).   Weight as of this encounter: 72 kg. Advance diet Up with therapy D/C IV fluids Discharge home with home health probable Friday. Follow-up at [facility] clinic in 2 weeks.  DVT Prophylaxis - Lovenox, Foot Pumps and TED hose Weight-Bearing as tolerated to right leg  Reche Dixon, PA-C Orthopaedic Surgery 06/07/2020, 7:00 AM

## 2020-06-07 NOTE — Progress Notes (Signed)
Pt had an episode of being confused during the night. Pt woke up crying that she had been kidnapped and called her husband at 3am. Pt was reassured and reoriented to reality. Pt reported that she suffers from occasional vivid dreams and she had experienced one. No further issues identified the rest of the shift. Pt utilized pain relief with effect.

## 2020-06-07 NOTE — TOC Progression Note (Signed)
Transition of Care [facility]) - Progression Note    Patient Details  Name: Felicia Snyder MRN: [id number] Date of Birth: May 30, 1946  Transition of Care [facility]) CM/SW [place], RN Phone Number: 06/07/2020, 2:20 PM  Clinical Narrative:     Patient agrees to a bedsearch to go to Rehab, She has been vaccinated, FL2, PASSR and bedsearch done, will review after obtained       Expected Discharge Plan and Services                                                 Social Determinants of Health (SDOH) Interventions    Readmission Risk Interventions No flowsheet data found.

## 2020-06-07 NOTE — Progress Notes (Signed)
OT Cancellation Note  Patient Details Name: Felicia Snyder MRN: [id number] DOB: February 02, 1946   Cancelled Treatment:    Reason Eval/Treat Not Completed: Pain limiting ability to participate. Upon entry pt reclined in chair reporting 7/10 pain and severe nausea - RN notified. Pt declines OT evaluation this morning, will follow up as able.   Dessie Coma, M.S. OTR/L  06/07/20, 11:15 AM  ascom [phone]

## 2020-06-07 NOTE — Progress Notes (Signed)
Physical Therapy Treatment Patient Details Name: Felicia Snyder MRN: [id number] DOB: [birth year] Today's Date: 06/07/2020    History of Present Illness Pt is 74 y.o. female s/p R TKA.  PMH includes: former speaker, CAD, angina, past MI, cardiac stents, GERD, cancer, and Parkinson's.    PT Comments    Pt received in chair with legs elevated upon arrival.  Pt was able to perform exercises while seated and is continuously performing with greater strength.  Pt then performed sit <> stand with min guard and was unable to remain in standing position >5 sec so she lowered herself in a controlled manner back to the chair.  Pt then received modA for sit <> stand and was able to have correct posture prior to transferring to bed.  Pt able to lift R LE much better in PM session during ambulation.  Pt then transferred to seated EOB when OT arrived for session.  Current discharge plans to SNF remain appropriate at this time.  Pt will continue to benefit from skilled therapy in order to address deficits listed below.     Follow Up Recommendations  SNF     Equipment Recommendations       Recommendations for Other Services       Precautions / Restrictions Precautions Precautions: Knee Precaution Booklet Issued: No Restrictions Weight Bearing Restrictions: Yes RLE Weight Bearing: Weight bearing as tolerated    Mobility  Bed Mobility Overal bed mobility: Needs Assistance Bed Mobility: Supine to Sit     Supine to sit: Min assist Sit to supine: Min assist   General bed mobility comments: Pt requires verbal cuing for hand placement and for sitting EOB.  Transfers Overall transfer level: Needs assistance Equipment used: Rolling walker (2 wheeled) Transfers: Sit to/from Stand Sit to Stand: Mod assist         General transfer comment: Pt required modA for transfer to standing and remaining upright initially.  Ambulation/Gait Ambulation/Gait assistance: Mod assist Gait Distance  (Feet): 4 Feet Assistive device: Rolling walker (2 wheeled) Gait Pattern/deviations: Step-to pattern;Decreased step length - left;Decreased stance time - right Gait velocity: decreased   General Gait Details: Pt able to lift RLE much better in PM session and made transferring much smoother.   Stairs             Wheelchair Mobility    Modified Rankin (Stroke Patients Only)       Balance Overall balance assessment: Needs assistance Sitting-balance support: Single extremity supported;Feet supported Sitting balance-Leahy Scale: Fair     Standing balance support: Bilateral upper extremity supported Standing balance-Leahy Scale: Poor Standing balance comment: Pt unable to remain upright without modA initially.                            Cognition Arousal/Alertness: Awake/alert Behavior During Therapy: WFL for tasks assessed/performed Overall Cognitive Status: Within Functional Limits for tasks assessed                                        Exercises Total Joint Exercises Ankle Circles/Pumps: AROM;Both;10 reps;Seated Quad Sets: AROM;Both;10 reps;Seated Gluteal Sets: AROM;Both;10 reps;Seated Heel Slides: AROM;Strengthening;Right;5 reps;Seated Hip ABduction/ADduction: AROM;Strengthening;Both;10 reps;Supine Straight Leg Raises: AROM;Strengthening;Left;AAROM;Right;10 reps;Supine Marching in Standing: AROM;Strengthening;Both;5 reps Other Exercises Other Exercises: Pt educated on utilization of bone foam and importance of straightening of knee after surgery. Other Exercises: LBD, self-drinking, sitting/standing balance/tolerance,  bed mobility    General Comments        Pertinent Vitals/Pain Pain Assessment: 0-10 Pain Score: 7  Pain Location: R knee Pain Descriptors / Indicators: Aching;Discomfort Pain Intervention(s): Limited activity within patient's tolerance;Monitored during session;Premedicated before session;Ice applied    Home  Living Family/patient expects to be discharged to:: Private residence Living Arrangements: Spouse/significant other Available Help at Discharge: Family;Friend(s);Available 24 hours/day Type of Home: House Home Access: Stairs to enter Entrance Stairs-Rails: None Home Layout: Multi-level;Able to live on main level with bedroom/bathroom Home Equipment: Gilford Rile - 2 wheels;Bedside commode      Prior Function Level of Independence: Independent          PT Goals (current goals can now be found in the care plan section) Acute Rehab PT Goals Patient Stated Goal: To be able to go home. PT Goal Formulation: With patient Time For Goal Achievement: 06/21/20 Potential to Achieve Goals: Fair Progress towards PT goals: Progressing toward goals    Frequency    BID      PT Plan Current plan remains appropriate    Co-evaluation              AM-PAC PT "6 Clicks" Mobility   Outcome Measure  Help needed turning from your back to your side while in a flat bed without using bedrails?: A Little Help needed moving from lying on your back to sitting on the side of a flat bed without using bedrails?: A Little Help needed moving to and from a bed to a chair (including a wheelchair)?: A Lot Help needed standing up from a chair using your arms (e.g., wheelchair or bedside chair)?: A Lot Help needed to walk in hospital room?: A Lot Help needed climbing 3-5 steps with a railing? : Total 6 Click Score: 13    End of Session Equipment Utilized During Treatment: Gait belt Activity Tolerance: Patient tolerated treatment well;Patient limited by pain Patient left: in bed;with call bell/phone within reach;Other (comment) (pt left with OT in room.) Nurse Communication: Mobility status PT Visit Diagnosis: Unsteadiness on feet (R26.81);Other abnormalities of gait and mobility (R26.89);Muscle weakness (generalized) (M62.81);History of falling (Z91.81);Difficulty in walking, not elsewhere classified  (R26.2);Pain Pain - Right/Left: Right Pain - part of body: Knee     Time: 2951-8841 PT Time Calculation (min) (ACUTE ONLY): 24 min  Charges:  $Therapeutic Exercise: 23-37 mins                     Gwenlyn Saran, PT, DPT 06/07/20, 3:09 PM

## 2020-06-07 NOTE — Evaluation (Signed)
Physical Therapy Evaluation Patient Details Name: Felicia Snyder MRN: [id number] DOB: July 26, 1946 Today's Date: 06/07/2020   History of Present Illness  Pt is 74 y.o. female s/p R TKA.  PMH includes: former speaker, CAD, angina, past MI, cardiac stents, GERD, cancer, and Parkinson's.     Clinical Impression  Pt received supine in bed following breakfast.  Pt was agreeable to therapy.  Pt did not have bone foam applied upon entering into the room, and pt was educated on importance of keeping that applied in order for the leg to heal in a straightened position.    Pt was able to perform exercises in supine as listed below.  Pt is slow with movements due to pain.  Pt required assistance to perform SLR of the RLE.  After supine exercises were performed, pt transitioned into sitting at EOB and required verbal cuing for hand placement and to bring herself to EOB for easier transfer.  Pt unable to stand from lowered surface, but was able to stand with modA and slightly elevated bed.  Pt then transferred to chair with modA for walker navigation and verbal cuing for foot placement.  Pt left in chair with all needs within reach and husband in room.  Pt will benefit from PT services in a SNF setting upon discharge to safely address deficits listed in patient problem list for decreased caregiver assistance and eventual return to PLOF.       Follow Up Recommendations SNF    Equipment Recommendations       Recommendations for Other Services       Precautions / Restrictions Precautions Precautions: Knee Precaution Booklet Issued: No Restrictions Weight Bearing Restrictions: Yes RLE Weight Bearing: Weight bearing as tolerated      Mobility  Bed Mobility Overal bed mobility: Needs Assistance Bed Mobility: Supine to Sit     Supine to sit: Min assist     General bed mobility comments: Pt requires verbal cuing for hand placement and for sitting EOB.  Transfers Overall transfer level:  Needs assistance Equipment used: Rolling walker (2 wheeled) Transfers: Sit to/from Stand Sit to Stand: Mod assist         General transfer comment: Pt required modA for transfer to standing and remaining upright initially.  Ambulation/Gait Ambulation/Gait assistance: Mod assist Gait Distance (Feet): 4 Feet Assistive device: Rolling walker (2 wheeled) Gait Pattern/deviations: Step-to pattern;Decreased step length - left;Decreased stance time - right;Shuffle Gait velocity: decreased   General Gait Details: Pt able to move feet, but has difficulty lifting the RLE.  Stairs            Wheelchair Mobility    Modified Rankin (Stroke Patients Only)       Balance Overall balance assessment: Needs assistance Sitting-balance support: Single extremity supported;Feet supported Sitting balance-Leahy Scale: Fair     Standing balance support: Bilateral upper extremity supported Standing balance-Leahy Scale: Poor Standing balance comment: Pt unable to remain upright without modA initially.                             Pertinent Vitals/Pain Pain Assessment: 0-10 Pain Score: 6  Pain Location: R knee Pain Descriptors / Indicators: Aching;Discomfort Pain Intervention(s): Limited activity within patient's tolerance;Monitored during session;Premedicated before session;Repositioned;Ice applied    Home Living Family/patient expects to be discharged to:: Private residence Living Arrangements: Spouse/significant other Available Help at Discharge: Family;Friend(s) Type of Home: House Home Access: Stairs to enter Entrance Stairs-Rails: None Entrance [employer]  of Steps: 3 Home Layout: Multi-level Home Equipment: [place] - 2 wheels;Bedside commode      Prior Function Level of Independence: Independent               Hand Dominance   Dominant Hand: Right    Extremity/Trunk Assessment   Upper Extremity Assessment Upper Extremity Assessment: Overall WFL for  tasks assessed    Lower Extremity Assessment Lower Extremity Assessment: RLE deficits/detail RLE Deficits / Details: Weakness noted in RLE from surgical procedure RLE: Unable to fully assess due to pain RLE Sensation: WNL       Communication   Communication: No difficulties  Cognition Arousal/Alertness: Awake/alert Behavior During Therapy: WFL for tasks assessed/performed Overall Cognitive Status: Within Functional Limits for tasks assessed                                        General Comments      Exercises Total Joint Exercises Ankle Circles/Pumps: AROM;Strengthening;Both;10 reps;Supine Quad Sets: AROM;Strengthening;Both;10 reps;Supine Gluteal Sets: AROM;Strengthening;Both;10 reps;Supine Heel Slides: AROM;Strengthening;Right;5 reps;Seated Hip ABduction/ADduction: AROM;Strengthening;Both;10 reps;Supine Straight Leg Raises: AROM;Strengthening;Left;AAROM;Right;10 reps;Supine Marching in Standing: AROM;Strengthening;Both;5 reps   Assessment/Plan    PT Assessment Patient needs continued PT services  PT Problem List Decreased strength;Decreased range of motion;Decreased activity tolerance;Decreased balance;Decreased mobility;Decreased knowledge of use of DME;Decreased safety awareness;Pain       PT Treatment Interventions DME instruction;Gait training;Stair training;Functional mobility training;Therapeutic activities;Therapeutic exercise;Balance training;Patient/family education    PT Goals (Current goals can be found in the Care Plan section)  Acute Rehab PT Goals Patient Stated Goal: To be able to go home. PT Goal Formulation: With patient Time For Goal Achievement: 06/21/20 Potential to Achieve Goals: Fair    Frequency BID   Barriers to discharge        Co-evaluation               AM-PAC PT "6 Clicks" Mobility  Outcome Measure Help needed turning from your back to your side while in a flat bed without using bedrails?: A Little Help  needed moving from lying on your back to sitting on the side of a flat bed without using bedrails?: A Little Help needed moving to and from a bed to a chair (including a wheelchair)?: A Lot Help needed standing up from a chair using your arms (e.g., wheelchair or bedside chair)?: A Lot Help needed to walk in hospital room?: A Lot Help needed climbing 3-5 steps with a railing? : Total 6 Click Score: 13    End of Session Equipment Utilized During Treatment: Gait belt Activity Tolerance: Patient tolerated treatment well;Patient limited by pain Patient left: in chair;with call bell/phone within reach;with chair alarm set;with family/visitor present;with SCD's reapplied Nurse Communication: Mobility status PT Visit Diagnosis: Unsteadiness on feet (R26.81);Other abnormalities of gait and mobility (R26.89);Muscle weakness (generalized) (M62.81);History of falling (Z91.81);Difficulty in walking, not elsewhere classified (R26.2);Pain Pain - Right/Left: Right Pain - part of body: Knee    Time: 0919-1005 PT Time Calculation (min) (ACUTE ONLY): 46 min   Charges:   PT Evaluation $PT Eval Low Complexity: 1 Low PT Treatments $Therapeutic Exercise: 23-37 mins       Gwenlyn Saran, PT, DPT 06/07/20, 11:20 AM

## 2020-06-08 NOTE — Progress Notes (Signed)
Physical Therapy Treatment Patient Details Name: Felicia Snyder MRN: [id number] DOB: 11/03/1946 Today's Date: 06/08/2020    History of Present Illness Pt is 74 y.o. female s/p R TKA.  PMH includes: former speaker, CAD, angina, past MI, cardiac stents, GERD, cancer, and Parkinson's.    PT Comments    Pt received in bed, reporting 7/10 pain upon arrival.  Pt agreeable to therapy and would like to walk today.  Pt performed bed exercises in supine position, and ROM was measured following TherEx.  Pt still lacking 17 deg of extension in supine position.  Pt will be reassessed in afternoon after being placed in bone foam.  Pt was able to walk to the nursing station with heavy use of UE pushing through [place].  Pt then transferred to recliner and able to perform with modA.  Pt given call bell and other needs with nursing and husband present in room following session.  Pt will benefit from PT services in a SNF setting upon discharge to safely address deficits listed in patient problem list for decreased caregiver assistance and eventual return to PLOF.     Follow Up Recommendations  SNF     Equipment Recommendations       Recommendations for Other Services       Precautions / Restrictions Precautions Precautions: Knee Precaution Booklet Issued: No Restrictions Weight Bearing Restrictions: Yes RLE Weight Bearing: Weight bearing as tolerated    Mobility  Bed Mobility Overal bed mobility: Needs Assistance Bed Mobility: Supine to Sit     Supine to sit: Min assist     General bed mobility comments: Pt requires verbal cuing for hand placement and for sitting EOB.  Transfers Overall transfer level: Needs assistance Equipment used: Rolling walker (2 wheeled) Transfers: Sit to/from Stand Sit to Stand: Mod assist         General transfer comment: Pt continues to require modA for transfer to standing and heavily uses UE to push through [place].  Ambulation/Gait Ambulation/Gait  assistance: Mod assist Gait Distance (Feet): 60 Feet Assistive device: Rolling walker (2 wheeled) Gait Pattern/deviations: Step-to pattern;Decreased step length - left;Decreased stance time - right Gait velocity: decreased   General Gait Details: Pt very slow and cautious with ambulating to nursing station, but had greater pacing coming back to the room.   Stairs             Wheelchair Mobility    Modified Rankin (Stroke Patients Only)       Balance Overall balance assessment: Needs assistance Sitting-balance support: Single extremity supported;Feet supported Sitting balance-Leahy Scale: Fair     Standing balance support: Bilateral upper extremity supported Standing balance-Leahy Scale: Poor Standing balance comment: Pt unable to remain upright without modA initially.                            Cognition Arousal/Alertness: Awake/alert Behavior During Therapy: WFL for tasks assessed/performed Overall Cognitive Status: Within Functional Limits for tasks assessed                                        Exercises Total Joint Exercises Ankle Circles/Pumps: AROM;Strengthening;Both;10 reps;Supine Quad Sets: AROM;Strengthening;Both;10 reps;Supine Gluteal Sets: AROM;Strengthening;Both;10 reps;Supine Heel Slides: AROM;Strengthening;Right;5 reps;Seated Hip ABduction/ADduction: AROM;Strengthening;Both;10 reps;Supine Straight Leg Raises: AROM;Strengthening;Left;AAROM;Right;10 reps;Supine Marching in Standing: AROM;Strengthening;Both;5 reps Other Exercises Other Exercises: Pt with continued education on importance of keeping leg  in extension to prevent contracture in the future.    General Comments        Pertinent Vitals/Pain Pain Assessment: 0-10 Pain Score: 7  Pain Location: R knee Pain Descriptors / Indicators: Aching;Discomfort Pain Intervention(s): Limited activity within patient's tolerance;Monitored during session;Premedicated before  session;Ice applied    Home Living                      Prior Function            PT Goals (current goals can now be found in the care plan section)      Frequency    BID      PT Plan Current plan remains appropriate    Co-evaluation              AM-PAC PT "6 Clicks" Mobility   Outcome Measure  Help needed turning from your back to your side while in a flat bed without using bedrails?: A Little Help needed moving from lying on your back to sitting on the side of a flat bed without using bedrails?: A Little Help needed moving to and from a bed to a chair (including a wheelchair)?: A Lot Help needed standing up from a chair using your arms (e.g., wheelchair or bedside chair)?: A Lot Help needed to walk in hospital room?: A Lot Help needed climbing 3-5 steps with a railing? : Total 6 Click Score: 13    End of Session Equipment Utilized During Treatment: Gait belt Activity Tolerance: Patient tolerated treatment well;Patient limited by pain Patient left: in chair;with call bell/phone within reach;with chair alarm set;with family/visitor present;with SCD's reapplied;with nursing/sitter in room Nurse Communication: Mobility status PT Visit Diagnosis: Unsteadiness on feet (R26.81);Other abnormalities of gait and mobility (R26.89);Muscle weakness (generalized) (M62.81);History of falling (Z91.81);Difficulty in walking, not elsewhere classified (R26.2);Pain Pain - Right/Left: Right Pain - part of body: Knee     Time: 3785-8850 PT Time Calculation (min) (ACUTE ONLY): 41 min  Charges:  $Gait Training: 23-37 mins $Therapeutic Exercise: 8-22 mins                     Gwenlyn Saran, PT, DPT 06/08/20, 10:45 AM

## 2020-06-08 NOTE — TOC Progression Note (Signed)
Transition of Care [facility]) - Progression Note    Patient Details  Name: Felicia Snyder MRN: [id number] Date of Birth: Feb 04, 1946  Transition of Care [facility]) CM/SW [place], RN Phone Number: 06/08/2020, 10:46 AM  Clinical Narrative:    Met with the patient in the room to discuss bed offers, she has decided to accept the bed offer from [facility], I notified Chris at [facility] to start the [employer] process, he agreed        Expected Discharge Plan and Services                                                 Social Determinants of Health (SDOH) Interventions    Readmission Risk Interventions No flowsheet data found.

## 2020-06-08 NOTE — Progress Notes (Signed)
  Subjective: 2 Days Post-Op Procedure(s) (LRB): TOTAL KNEE ARTHROPLASTY (Right) Patient reports pain as mild.   Patient is well, and has had no acute complaints or problems Plan is to go Rehab after hospital stay. Negative for chest pain and shortness of breath Fever: no Gastrointestinal: Negative for nausea and vomiting  Objective: Vital signs in last 24 hours: Temp:  [97.7 F (36.5 C)-98.3 F (36.8 C)] 98 F (36.7 C) (08/04 2337) Pulse Rate:  [67-91] 91 (08/04 2337) Resp:  [16-18] 16 (08/04 2337) BP: (119-152)/(66-81) 140/81 (08/04 2337) SpO2:  [91 %-98 %] 91 % (08/04 2337)  Intake/Output from previous day:  Intake/Output Summary (Last 24 hours) at 06/08/2020 0701 Last data filed at 06/07/2020 1337 Gross per 24 hour  Intake 200 ml  Output --  Net 200 ml    Intake/Output this shift: No intake/output data recorded.  Labs: Recent Labs    06/06/20 1319 06/07/20 0548  HGB 11.4* 10.2*   Recent Labs    06/06/20 1319 06/07/20 0548  WBC 9.6 9.2  RBC 3.92 3.49*  HCT 34.8* 30.8*  PLT 207 185   Recent Labs    06/06/20 1319 06/07/20 0548  NA  --  136  K  --  4.1  CL  --  104  CO2  --  27  BUN  --  15  CREATININE 0.50 0.63  GLUCOSE  --  120*  CALCIUM  --  8.7*   No results for input(s): LABPT, INR in the last 72 hours.   EXAM General - Patient is Alert and Oriented Extremity - Neurovascular intact Sensation intact distally Dorsiflexion/Plantar flexion intact Compartment soft Dressing/Incision - clean, dry, with the wound VAC intact Motor Function - intact, moving foot and toes well on exam. Ambulated 4 feet.  Past Medical History:  Diagnosis Date  . Anal fissure   . Cancer [facility]) 1996   colon cancer  . Cancer ([place]) 2015   melanoma/ right shoulder  . Cataract   . GERD (gastroesophageal reflux disease)   . Hemorrhoids   . Myocardial infarction ([place])   . Parkinson disease ([place])   . Premature osteoporosis   . Seasonal allergies      Assessment/Plan: 2 Days Post-Op Procedure(s) (LRB): TOTAL KNEE ARTHROPLASTY (Right) Active Problems:   Status post total knee replacement using cement, right  Estimated body mass index is 27.25 kg/m as calculated from the following:   Height as of this encounter: 5\' 4"  (1.626 m).   Weight as of this encounter: 72 kg. Continue work on [employer] with PT/OT Discharge to Rehab probable Friday. Follow-up at [facility] clinic in 2 weeks.  DVT Prophylaxis - Lovenox, Foot Pumps and TED hose Weight-Bearing as tolerated to right leg  Reche Dixon, PA-C Orthopaedic Surgery 06/08/2020, 7:01 AM

## 2020-06-08 NOTE — Progress Notes (Signed)
Physical Therapy Treatment Patient Details Name: Felicia Snyder MRN: [id number] DOB: 08-Jan-1946 Today's Date: 06/08/2020    History of Present Illness Pt is 74 y.o. female s/p R TKA.  PMH includes: former speaker, CAD, angina, past MI, cardiac stents, GERD, cancer, and Parkinson's.    PT Comments    Pt received in semi-Fowler's position in bed.  Pt reports 7/10 pain, but is willing to participate in therapy.  Pt able to transition perform therapeutic exercises as list below, and then transfer to side of bed.  Pt continues to require assistance for standing, however is decreased to minA from therapist.  Pt then proceeded to walk around the nursing station and back to the room.  Pt requested to lie back down in bed and was able to transition much more smoothly to the sit/supine position.  Pt will continue to benefit from PT services in a SNF setting upon discharge to safely address deficits listed in patient problem list for decreased caregiver assistance and eventual return to PLOF.     Follow Up Recommendations  SNF     Equipment Recommendations       Recommendations for Other Services       Precautions / Restrictions Precautions Precautions: Knee Precaution Booklet Issued: No Restrictions Weight Bearing Restrictions: Yes RLE Weight Bearing: Weight bearing as tolerated    Mobility  Bed Mobility Overal bed mobility: Needs Assistance Bed Mobility: Supine to Sit     Supine to sit: Min assist Sit to supine: Min assist   General bed mobility comments: Pt requires verbal cuing for hand placement and for sitting EOB.  Transfers Overall transfer level: Needs assistance Equipment used: Rolling walker (2 wheeled) Transfers: Sit to/from Stand Sit to Stand: Mod assist         General transfer comment: Pt continues to require modA for transfer to standing and heavily uses UE to push through [place].  Ambulation/Gait Ambulation/Gait assistance: Mod assist Gait Distance  (Feet): 200 Feet Assistive device: Rolling walker (2 wheeled) Gait Pattern/deviations: Step-to pattern;Decreased step length - left;Decreased stance time - right Gait velocity: decreased   General Gait Details: Pt deliberate with movements, but was able to perform one lap around nursing station.   Stairs             Wheelchair Mobility    Modified Rankin (Stroke Patients Only)       Balance Overall balance assessment: Needs assistance Sitting-balance support: Single extremity supported;Feet supported Sitting balance-Leahy Scale: Fair     Standing balance support: Bilateral upper extremity supported Standing balance-Leahy Scale: Poor Standing balance comment: Pt unable to remain upright without modA initially.                            Cognition Arousal/Alertness: Awake/alert Behavior During Therapy: WFL for tasks assessed/performed Overall Cognitive Status: Within Functional Limits for tasks assessed                                        Exercises Total Joint Exercises Ankle Circles/Pumps: AROM;Strengthening;Both;10 reps;Supine Quad Sets: AROM;Strengthening;Both;10 reps;Supine Heel Slides: AROM;Strengthening;Right;5 reps;Seated Marching in Standing: AROM;Strengthening;Both;5 reps Other Exercises Other Exercises: Pt with continued education on importance of keeping leg in extension to prevent contracture in the future.    General Comments        Pertinent Vitals/Pain Pain Assessment: 0-10 Pain Score: 7  Pain  Location: R knee Pain Descriptors / Indicators: Aching;Discomfort Pain Intervention(s): Limited activity within patient's tolerance;Monitored during session;Premedicated before session;Ice applied    Home Living                      Prior Function            PT Goals (current goals can now be found in the care plan section) Acute Rehab PT Goals Patient Stated Goal: To be able to go home. PT Goal  Formulation: With patient Time For Goal Achievement: 06/21/20 Potential to Achieve Goals: Fair Progress towards PT goals: Progressing toward goals    Frequency    BID      PT Plan Current plan remains appropriate    Co-evaluation              AM-PAC PT "6 Clicks" Mobility   Outcome Measure  Help needed turning from your back to your side while in a flat bed without using bedrails?: A Little Help needed moving from lying on your back to sitting on the side of a flat bed without using bedrails?: A Little Help needed moving to and from a bed to a chair (including a wheelchair)?: A Little Help needed standing up from a chair using your arms (e.g., wheelchair or bedside chair)?: A Little Help needed to walk in hospital room?: A Little Help needed climbing 3-5 steps with a railing? : Total 6 Click Score: 16    End of Session Equipment Utilized During Treatment: Gait belt Activity Tolerance: Patient tolerated treatment well;Patient limited by pain Patient left: with call bell/phone within reach;with family/visitor present;with SCD's reapplied;in bed;with bed alarm set Nurse Communication: Mobility status PT Visit Diagnosis: Unsteadiness on feet (R26.81);Other abnormalities of gait and mobility (R26.89);Muscle weakness (generalized) (M62.81);History of falling (Z91.81);Difficulty in walking, not elsewhere classified (R26.2);Pain Pain - Right/Left: Right Pain - part of body: Knee     Time: 1310-1349 PT Time Calculation (min) (ACUTE ONLY): 39 min  Charges:  $Gait Training: 23-37 mins $Therapeutic Exercise: 8-22 mins                      Gwenlyn Saran, PT, DPT 06/08/20, 2:48 PM

## 2020-06-09 LAB — SARS CORONAVIRUS 2 BY RT PCR (HOSPITAL ORDER, PERFORMED IN ~~LOC~~ HOSPITAL LAB): SARS Coronavirus 2: NEGATIVE

## 2020-06-09 NOTE — Progress Notes (Signed)
Physical Therapy Treatment Patient Details Name: Felicia Snyder MRN: [id number] DOB: 10-08-1946 Today's Date: 06/09/2020    History of Present Illness Pt is 74 y.o. female s/p R TKA.  PMH includes: former speaker, CAD, angina, past MI, cardiac stents, GERD, cancer, and Parkinson's.    PT Comments    Pt received upright in recliner upon arrival to room.  Pt agreeable to therapy.  PT discussed plans to perform stair training today and pt was agreeable to transition as well.  Pt transferred to PT gym in recliner and was given verbal and visual cuing prior to starting stair training.  Pt then was able to perform with verbal cuing up 4 steps with no railing, which is similar to home.  Pt performed well with backwards technique and other PT supporting front of FWW walker throughout session.  Pt able to perform stairs twice with a seated rest break in between bouts.  Pt husband entered into gym and assisted pt with verbal cuing for support of the FWW.  Pt then ambulated back to room receiving vc for heel to toe walking to support extension of the R knee.  Pt transferred back to chair with all needs within reach.  Pt will benefit from PT services in a SNF setting upon discharge to safely address deficits listed in patient problem list for decreased caregiver assistance and eventual return to PLOF.     Follow Up Recommendations  SNF     Equipment Recommendations       Recommendations for Other Services       Precautions / Restrictions Precautions Precautions: Knee Precaution Booklet Issued: No Restrictions Weight Bearing Restrictions: Yes RLE Weight Bearing: Weight bearing as tolerated    Mobility  Bed Mobility                  Transfers Overall transfer level: Needs assistance Equipment used: Rolling walker (2 wheeled) Transfers: Sit to/from Stand Sit to Stand: Mod assist         General transfer comment: Pt continues to require modA for transfer to standing and  heavily uses UE to push through [place].  Ambulation/Gait Ambulation/Gait assistance: Mod assist Gait Distance (Feet): 120 Feet Assistive device: Rolling walker (2 wheeled) Gait Pattern/deviations: Step-to pattern;Decreased step length - left;Decreased stance time - right Gait velocity: decreased   General Gait Details: Pt maneuvering walker much more efficiently today.   Stairs Stairs: Yes Stairs assistance: Mod assist Stair Management: No rails;Backwards;With walker Number of Stairs: 4 General stair comments: Pt was able to perform 4 steps with use of FWW and ascending backwards with husband supporting walker from the front.  Pt required verbal cuing throughout both sets for hand and walker placement.   Wheelchair Mobility    Modified Rankin (Stroke Patients Only)       Balance Overall balance assessment: Needs assistance Sitting-balance support: Single extremity supported;Feet supported Sitting balance-Leahy Scale: Fair     Standing balance support: Bilateral upper extremity supported Standing balance-Leahy Scale: Poor Standing balance comment: Pt utilizes FWW and UE support heavily to remain in standing.                            Cognition Arousal/Alertness: Awake/alert Behavior During Therapy: WFL for tasks assessed/performed Overall Cognitive Status: Within Functional Limits for tasks assessed  Exercises Other Exercises Other Exercises: Pt performed stair training with 4 steps x2 with a seated rest break between bouts.  Pt requires verbal cuing for hand and walker placement. Other Exercises: Pt able to ambulate to room from therapy gym and transfer from standing to seated in recliner.    General Comments        Pertinent Vitals/Pain Pain Assessment: 0-10 Pain Score: 7  Pain Location: R knee Pain Descriptors / Indicators: Aching;Discomfort Pain Intervention(s): Limited activity within  patient's tolerance;Monitored during session;Premedicated before session;Ice applied    Home Living                      Prior Function            PT Goals (current goals can now be found in the care plan section) Acute Rehab PT Goals Patient Stated Goal: To be able to go home. PT Goal Formulation: With patient Time For Goal Achievement: 06/21/20 Potential to Achieve Goals: Good Progress towards PT goals: Progressing toward goals    Frequency    BID      PT Plan Current plan remains appropriate    Co-evaluation              AM-PAC PT "6 Clicks" Mobility   Outcome Measure  Help needed turning from your back to your side while in a flat bed without using bedrails?: A Little Help needed moving from lying on your back to sitting on the side of a flat bed without using bedrails?: A Little Help needed moving to and from a bed to a chair (including a wheelchair)?: A Little Help needed standing up from a chair using your arms (e.g., wheelchair or bedside chair)?: A Little Help needed to walk in hospital room?: A Little Help needed climbing 3-5 steps with a railing? : A Little 6 Click Score: 18    End of Session Equipment Utilized During Treatment: Gait belt Activity Tolerance: Patient tolerated treatment well;Patient limited by pain Patient left: with family/visitor present;in chair;with chair alarm set Nurse Communication: Mobility status PT Visit Diagnosis: Unsteadiness on feet (R26.81);Other abnormalities of gait and mobility (R26.89);Muscle weakness (generalized) (M62.81);History of falling (Z91.81);Difficulty in walking, not elsewhere classified (R26.2);Pain Pain - Right/Left: Right Pain - part of body: Knee     Time: 6503-5465 PT Time Calculation (min) (ACUTE ONLY): 30 min  Charges:  $Gait Training: 23-37 mins                      Gwenlyn Saran, PT, DPT 06/09/20, 11:09 AM

## 2020-06-09 NOTE — Progress Notes (Deleted)
PT discharged at this time - reviewed discharge in [place] with daughter and provided pt with spanish discharge summary , and drain care notes.  Understanding is voiced of all instructions

## 2020-06-09 NOTE — Plan of Care (Signed)
°  Problem: Education: Goal: Knowledge of the prescribed therapeutic regimen will improve Outcome: Progressing Goal: Individualized Educational Video(s) Outcome: Progressing   Problem: Activity: Goal: Ability to avoid complications of mobility impairment will improve Outcome: Progressing Goal: Range of joint motion will improve Outcome: Progressing   Problem: Clinical Measurements: Goal: Postoperative complications will be avoided or minimized Outcome: Progressing   Problem: Pain Management: Goal: Pain level will decrease with appropriate interventions Outcome: Progressing   Problem: Skin Integrity: Goal: Will show signs of wound healing Outcome: Progressing  Pt up in chair - pain managed with prns - polar care in place - covid swab completed for rehab placement

## 2020-06-09 NOTE — Progress Notes (Signed)
   06/09/20 0900  Clinical Encounter Type  Visited With Patient and family together  Visit Type Follow-up  Referral From Chaplain  Consult/Referral To Chaplain  Chaplain stopped in to check on patient. Patient was eating breakfast and husband was a bedside. Patient said she is feeling better and husband said we are doing the best we can.

## 2020-06-09 NOTE — Discharge Summary (Addendum)
Physician Discharge Summary  Subjective: 6 Days Post-Op Procedure(s) (LRB): TOTAL KNEE ARTHROPLASTY (Right) Patient reports pain as mild.   Patient seen in rounds with Dr. Rudene Christians. Patient is well, and has had no acute complaints or problems Patient is stable for discharge home with HHPT.  Physician Discharge Summary  Patient ID: Felicia Snyder MRN: [id number] DOB/AGE: 11/05/1945 74 y.o.  Admit date: 06/06/2020 Discharge date: 06/12/2020  Admission Diagnoses:  Discharge Diagnoses:  Active Problems:   Status post total knee replacement using cement, right   Discharged Condition: fair  Hospital Course: The patient is postop day 3 from a right total knee replacement.  She has progressed well with therapy.  She initially was ambulating 4 feet but has progressed well over the weekend.  She will do stairs this morning.  She is doing better with her pain control.  Her vitals have remained stable.  Original plan was for discharge to SNF, plan will be for discharge home today with HHPT.  Treatments: surgery:  right TOTAL KNEE ARTHROPLASTY  SURGEON: Laurene Footman, MD  ASSISTANTS: Talmage Coin RNFA  ANESTHESIA: spinal  EBL:  BLOOD ADMINISTERED:none  DRAINS: Incisional wound VAC  LOCAL MEDICATIONS USED: MARCAINE    and OTHER morphine Toradol and Exparel  SPECIMEN: No Specimen  DISPOSITION OF SPECIMEN: N/A  COUNTS: YES  TOURNIQUET: 59 minutes at 300 mm Hg  IMPLANTS:Medacta GMK sphere system with 3 rightfemur, 3 righttibia with short stem and 62mm insert. Size 2patella, all components cemented.  Discharge Exam: Blood pressure 130/69, pulse 84, temperature 98.6 F (37 C), temperature source Oral, resp. rate 16, height 5\' 4"  (1.626 m), weight 72 kg, SpO2 94 %.  Disposition: Plan for discharge home today with HHPT.   Allergies as of 06/12/2020   No Known Allergies     Medication List    TAKE these medications   acyclovir 200 MG  capsule Commonly known as: ZOVIRAX Take 1 capsule (200 mg total) by mouth 5 (five) times daily. For five days per flare. What changed:   when to take this  reasons to take this  additional instructions   aspirin EC 81 MG tablet Take 81 mg by mouth daily.   atorvastatin 80 MG tablet Commonly known as: LIPITOR Take 80 mg by mouth at bedtime.   CALCIUM 1200+D3 PO Take 1 tablet by mouth daily at 12 noon.   carbidopa-levodopa 25-100 MG tablet Commonly known as: SINEMET IR Take 1 tablet by mouth 2 (two) times daily.   CoQ10 200 MG Caps Take 200 mg by mouth daily at 12 noon.   cyanocobalamin 1000 MCG tablet Take 1,000 mcg by mouth daily at 12 noon.   enoxaparin 40 MG/0.4ML injection Commonly known as: LOVENOX Inject 0.4 mLs (40 mg total) into the skin daily for 14 doses.   Fish Oil 1000 MG Caps Take 1,000 mg by mouth daily at 12 noon.   HYDROcodone-acetaminophen 5-325 MG tablet Commonly known as: NORCO/VICODIN Take 1-2 tablets by mouth every 4 (four) hours as needed for moderate pain (pain score 4-6).   loratadine 10 MG tablet Commonly known as: CLARITIN Take 10 mg by mouth daily as needed for allergies.   Lubricant Eye Drops 0.4-0.3 % Soln Generic drug: Polyethyl Glycol-Propyl Glycol Place 1 drop into both eyes 3 (three) times daily as needed (irritation/dry eyes).   meloxicam 7.5 MG tablet Commonly known as: MOBIC TAKE 1 TABLET BY MOUTH EVERY DAY What changed:   when to take this  reasons to take this  methocarbamol 500 MG tablet Commonly known as: ROBAXIN Take 1 tablet (500 mg total) by mouth every 6 (six) hours as needed for muscle spasms.   nitroGLYCERIN 0.4 MG SL tablet Commonly known as: NITROSTAT Place 0.4 mg under the tongue every 5 (five) minutes x 3 doses as needed for chest pain.   pramipexole 1 MG tablet Commonly known as: MIRAPEX Take 1 mg by mouth 2 (two) times daily.   pramipexole 0.5 MG tablet Commonly known as: MIRAPEX Take 0.5 mg  by mouth in the morning and at bedtime.   traMADol 50 MG tablet Commonly known as: ULTRAM Take 1 tablet (50 mg total) by mouth every 6 (six) hours.   vitamin C 1000 MG tablet Take 1,000 mg by mouth daily at 12 noon.   vitamin E 180 MG (400 UNITS) capsule Take 400 Units by mouth daily at 12 noon.   Zinc 50 MG Tabs Take 50 mg by mouth daily at 12 noon.            Durable Medical Equipment  (From admission, onward)         Start     Ordered   06/06/20 1247  DME Walker rolling  Once       Question Answer Comment  Walker: With 5 Inch Wheels   Patient needs a walker to treat with the following condition S/P TKR (total knee replacement) using cement, right      06/06/20 1246   06/06/20 1247  DME 3 n 1  Once        06/06/20 1246   06/06/20 1247  DME Bedside commode  Once       Question:  Patient needs a bedside commode to treat with the following condition  Answer:  S/P TKR (total knee replacement) using cement, right   06/06/20 1246          Contact information for follow-up providers    Duanne Guess, PA-C. Go in 2 weeks.   Specialties: Orthopedic Surgery, Emergency Medicine Why: For staple removal; Facility will make Appt Contact information: [place] [address] [phone]            Contact information for after-discharge care    Destination    [facility] Preferred SNF.   Service: Skilled Nursing Contact information: [address] [place] [phone]                 Signed: Judson Roch PA-C 06/12/2020, 7:42 AM  Objective: Vital signs in last 24 hours: Temp:  [97.6 F (36.4 C)-98.6 F (37 C)] 98.6 F (37 C) (08/08 2358) Pulse Rate:  [71-84] 84 (08/08 2358) Resp:  [16] 16 (08/08 2358) BP: (113-130)/(56-69) 130/69 (08/08 2358) SpO2:  [94 %-100 %] 94 % (08/08 2358)  Intake/Output from previous day:  Intake/Output Summary (Last  24 hours) at 06/12/2020 0742 Last data filed at 06/11/2020 1003 Gross per 24 hour  Intake 240 ml  Output --  Net 240 ml    Intake/Output this shift: No intake/output data recorded.  Labs: No results for input(s): HGB in the last 72 hours. No results for input(s): WBC, RBC, HCT, PLT in the last 72 hours. No results for input(s): NA, K, CL, CO2, BUN, CREATININE, GLUCOSE, CALCIUM in the last 72 hours. No results for input(s): LABPT, INR in the last 72 hours.  EXAM: General - Patient is Alert and Oriented Extremity - Neurovascular intact  Sensation intact distally Dorsiflexion/Plantar flexion intact Compartment soft Incision - clean, dry, with the wound VAC in place Motor Function -plantarflexion and dorsiflexion are intact.  Able to do a straight leg raise with mild assistance.  Assessment/Plan: 6 Days Post-Op Procedure(s) (LRB): TOTAL KNEE ARTHROPLASTY (Right) Procedure(s) (LRB): TOTAL KNEE ARTHROPLASTY (Right) Past Medical History:  Diagnosis Date   Anal fissure    Cancer ([place]) 1996   colon cancer   Cancer ([place]) 2015   melanoma/ right shoulder   Cataract    GERD (gastroesophageal reflux disease)    Hemorrhoids    Myocardial infarction ([place])    Parkinson disease ([place])    Premature osteoporosis    Seasonal allergies    Active Problems:   Status post total knee replacement using cement, right  Estimated body mass index is 27.25 kg/m as calculated from the following:   Height as of this encounter: 5\' 4"  (1.626 m).   Weight as of this encounter: 72 kg. Advance diet Up with therapy Discharge to SNF Diet - Regular diet Follow up - in 2 weeks Activity - WBAT Disposition - Home with HHPT. Condition Upon Discharge - Stable DVT Prophylaxis - Lovenox and TED hose  J. Cameron Proud, PA-C Orthopaedic Surgery 06/12/2020, 7:42 AM

## 2020-06-09 NOTE — Progress Notes (Signed)
  Subjective: 3 Days Post-Op Procedure(s) (LRB): TOTAL KNEE ARTHROPLASTY (Right) Patient reports pain as mild.   Patient is well, and has had no acute complaints or problems Plan is to go Rehab after hospital stay. Negative for chest pain and shortness of breath Fever: no Gastrointestinal: Negative for nausea and vomiting  Objective: Vital signs in last 24 hours: Temp:  [98 F (36.7 C)-98.6 F (37 C)] 98.4 F (36.9 C) (08/06 0045) Pulse Rate:  [81-84] 84 (08/06 0045) Resp:  [16-17] 17 (08/06 0045) BP: (128-144)/(70-93) 128/80 (08/06 0045) SpO2:  [92 %-94 %] 92 % (08/06 0045)  Intake/Output from previous day:  Intake/Output Summary (Last 24 hours) at 06/09/2020 0709 Last data filed at 06/08/2020 1800 Gross per 24 hour  Intake 480 ml  Output --  Net 480 ml    Intake/Output this shift: No intake/output data recorded.  Labs: Recent Labs    06/06/20 1319 06/07/20 0548  HGB 11.4* 10.2*   Recent Labs    06/06/20 1319 06/07/20 0548  WBC 9.6 9.2  RBC 3.92 3.49*  HCT 34.8* 30.8*  PLT 207 185   Recent Labs    06/06/20 1319 06/07/20 0548  NA  --  136  K  --  4.1  CL  --  104  CO2  --  27  BUN  --  15  CREATININE 0.50 0.63  GLUCOSE  --  120*  CALCIUM  --  8.7*   No results for input(s): LABPT, INR in the last 72 hours.   EXAM General - Patient is Alert and Oriented Extremity - Neurovascular intact Sensation intact distally Dorsiflexion/Plantar flexion intact Compartment soft Dressing/Incision - clean, dry, with the wound VAC intact Motor Function - intact, moving foot and toes well on exam. Ambulated 200 feet with physical therapy.  Past Medical History:  Diagnosis Date  . Anal fissure   . Cancer [facility]) 1996   colon cancer  . Cancer ([place]) 2015   melanoma/ right shoulder  . Cataract   . GERD (gastroesophageal reflux disease)   . Hemorrhoids   . Myocardial infarction ([place])   . Parkinson disease ([place])   . Premature osteoporosis   . Seasonal allergies      Assessment/Plan: 3 Days Post-Op Procedure(s) (LRB): TOTAL KNEE ARTHROPLASTY (Right) Active Problems:   Status post total knee replacement using cement, right  Estimated body mass index is 27.25 kg/m as calculated from the following:   Height as of this encounter: 5\' 4"  (1.626 m).   Weight as of this encounter: 72 kg. Continue work on [employer] Up with PT/OT Discharge to Rehab today. Follow-up at [facility] clinic in 2 weeks.  DVT Prophylaxis - Lovenox, Foot Pumps and TED hose Weight-Bearing as tolerated to right leg  Reche Dixon, PA-C Orthopaedic Surgery 06/09/2020, 7:09 AM

## 2020-06-09 NOTE — Care Management Important Message (Signed)
Important Message  Patient Details  Name: Felicia Snyder MRN: [id number] Date of Birth: [birth year]   Medicare Important Message Given:  Yes     Felicia Snyder A Felicia Snyder 06/09/2020, 10:43 AM

## 2020-06-09 NOTE — Progress Notes (Signed)
Physical Therapy Treatment Patient Details Name: Felicia Snyder MRN: [id number] DOB: 1946/03/27 Today's Date: 06/09/2020    History of Present Illness Pt is 74 y.o. female s/p R TKA.  PMH includes: former speaker, CAD, angina, past MI, cardiac stents, GERD, cancer, and Parkinson's.    PT Comments    Pt received in recliner upon entry to room.  Pt agreeable to therapy.  Pt able to transfer much more easily into standing position, and was able to maintain upright position with minA from therapist.  Pt then proceeded to ambulate around the nursing station with no difficulties.  Pt then reported she needed to use the restroom, and was able to transition to the bedside commode.  Pt requested therapist leave the room, so nursing was notified and was in room when therapist left.  Current discharge plans revisited and discussed with Education officer, museum.  Pt will benefit from PT services in a HHPT setting upon discharge to safely address deficits listed in patient problem list for decreased caregiver assistance and eventual return to PLOF.  Due to significant progress made in the last 24 hours, pt will benefit from HHPT rather than STR.       Follow Up Recommendations  Home health PT     Equipment Recommendations       Recommendations for Other Services       Precautions / Restrictions Precautions Precautions: Knee Precaution Booklet Issued: No Restrictions Weight Bearing Restrictions: Yes RLE Weight Bearing: Weight bearing as tolerated    Mobility  Bed Mobility                  Transfers Overall transfer level: Needs assistance Equipment used: Rolling walker (2 wheeled) Transfers: Sit to/from Stand Sit to Stand: Min assist         General transfer comment: Pt performed much better with use of UE for support, but not as heavily needed as in previous sessions.  Ambulation/Gait Ambulation/Gait assistance: Min guard Gait Distance (Feet): 180 Feet Assistive device: Rolling  walker (2 wheeled) Gait Pattern/deviations: Step-to pattern;Decreased step length - left;Decreased stance time - right Gait velocity: decreased   General Gait Details: Pt confident with maneuvering walker around room and ambulating around the nursing station.   Stairs             Wheelchair Mobility    Modified Rankin (Stroke Patients Only)       Balance Overall balance assessment: Needs assistance Sitting-balance support: Feet supported Sitting balance-Leahy Scale: Good     Standing balance support: Bilateral upper extremity supported Standing balance-Leahy Scale: Fair Standing balance comment: Pt utilizes FWW and UE support to keep upright posture.                            Cognition Arousal/Alertness: Awake/alert Behavior During Therapy: WFL for tasks assessed/performed Overall Cognitive Status: Within Functional Limits for tasks assessed                                        Exercises Other Exercises Other Exercises: Pt ambulated around nursing station and within the hospital room with decreased difficulty compared to previous sessions.    General Comments        Pertinent Vitals/Pain Pain Assessment: 0-10 Pain Score: 7  Pain Location: R knee Pain Descriptors / Indicators: Aching;Discomfort Pain Intervention(s): Limited activity within patient's tolerance;Monitored during  session;Repositioned;Ice applied    Home Living                      Prior Function            PT Goals (current goals can now be found in the care plan section) Acute Rehab PT Goals Patient Stated Goal: To be able to go home. PT Goal Formulation: With patient Time For Goal Achievement: 06/21/20 Potential to Achieve Goals: Good Progress towards PT goals: Progressing toward goals    Frequency    BID      PT Plan Discharge plan needs to be updated    Co-evaluation              AM-PAC PT "6 Clicks" Mobility   Outcome  Measure  Help needed turning from your back to your side while in a flat bed without using bedrails?: A Little Help needed moving from lying on your back to sitting on the side of a flat bed without using bedrails?: A Little Help needed moving to and from a bed to a chair (including a wheelchair)?: A Little Help needed standing up from a chair using your arms (e.g., wheelchair or bedside chair)?: A Little Help needed to walk in hospital room?: A Little Help needed climbing 3-5 steps with a railing? : A Little 6 Click Score: 18    End of Session Equipment Utilized During Treatment: Gait belt Activity Tolerance: Patient tolerated treatment well;Patient limited by pain Patient left: with nursing/sitter in room (pt left on bedside commode with nursing in room.) Nurse Communication: Mobility status PT Visit Diagnosis: Unsteadiness on feet (R26.81);Other abnormalities of gait and mobility (R26.89);Muscle weakness (generalized) (M62.81);History of falling (Z91.81);Difficulty in walking, not elsewhere classified (R26.2);Pain Pain - Right/Left: Right Pain - part of body: Knee     Time: 4332-9518 PT Time Calculation (min) (ACUTE ONLY): 15 min  Charges:  $Gait Training: 8-22 mins                      Gwenlyn Saran, PT, DPT 06/09/20, 4:50 PM

## 2020-06-09 NOTE — Plan of Care (Signed)
  Problem: Activity: Goal: Ability to avoid complications of mobility impairment will improve Outcome: Progressing   

## 2020-06-10 MED ORDER — FLEET ENEMA 7-19 GM/118ML RE ENEM: 1.0000 | ENEMA | Freq: Once | RECTAL | Status: DC

## 2020-06-10 NOTE — Progress Notes (Signed)
PT Cancellation Note  Patient Details Name: Felicia Snyder MRN: [id number] DOB: 27-Nov-1945   Cancelled Treatment:     PT attempt. Pt politely refused at this time. She reports she just returned to bed after going to [facility] for BM. Pt endorses fatigue and pain and is awaiting pain meds from RN at this time. Therapist quickly reviewed HEP handout and pt states she will continue to do I'ly. Unable to formally measure ROM this date however ROM not limiting pt's progress with mobility.  Acute PT recommends DC to home but pt requesting to go to SNF.    Willette Pa 06/10/2020, 3:24 PM

## 2020-06-10 NOTE — Progress Notes (Signed)
Physical Therapy Treatment Patient Details Name: Felicia Snyder MRN: [id number] DOB: 1946-05-10 Today's Date: 06/10/2020    History of Present Illness Pt is 74 y.o. female s/p R TKA.  PMH includes: former speaker, CAD, angina, past MI, cardiac stents, GERD, cancer, and Parkinson's.    PT Comments    Pt was seated up in recliner with spouse present upon arriving. She agrees to PT session. She states she feels she needs rehab at [place]. Therapist discussed that therapy is now recommending home with HHPT. She does not feel she can safely manage at home. Pt required CGA to stand and ambulated 160 ft with extremely slow cadence. No LOB or unsteadiness noted. Returned to room to perform ther ex handout however pt requested to use BR. Pt was seated on toilet post session with call bell in reach and RN tech aware. Acute PT will continue to follow and progress per POC.    Follow Up Recommendations  Home health PT     Equipment Recommendations  Rolling walker with 5" wheels;3in1 (PT)    Recommendations for Other Services       Precautions / Restrictions Precautions Precautions: Knee Precaution Booklet Issued: Yes (comment) Restrictions Weight Bearing Restrictions: Yes RLE Weight Bearing: Weight bearing as tolerated    Mobility  Bed Mobility               General bed mobility comments: not formally test. pt in recliner pre session and in BR post session  Transfers Overall transfer level: Needs assistance Equipment used: Rolling walker (2 wheeled) Transfers: Sit to/from Stand Sit to Stand: Min guard         General transfer comment: CGA for safety to STS from recliner  Ambulation/Gait Ambulation/Gait assistance: Min guard Gait Distance (Feet): 160 Feet Assistive device: Rolling walker (2 wheeled) Gait Pattern/deviations: Step-to pattern;Decreased step length - left;Decreased stance time - right Gait velocity: decreased   General Gait Details: pt has very slow cadence  with poor gait posture however no LOB or unsteadiness.   Stairs             Wheelchair Mobility    Modified Rankin (Stroke Patients Only)       Balance Overall balance assessment: Modified Independent Sitting-balance support: Feet supported Sitting balance-Leahy Scale: Good     Standing balance support: Bilateral upper extremity supported Standing balance-Leahy Scale: Good                              Cognition Arousal/Alertness: Awake/alert Behavior During Therapy: WFL for tasks assessed/performed;Flat affect Overall Cognitive Status: Within Functional Limits for tasks assessed                                 General Comments: Pt was A and O x 4 with spouse in room at bedside. She agrees to PT session.       Exercises      General Comments        Pertinent Vitals/Pain Pain Assessment: No/denies pain Pain Score: 0-No pain Pain Location: R knee Pain Descriptors / Indicators: Discomfort Pain Intervention(s): Limited activity within patient's tolerance;Monitored during session;Premedicated before session;Repositioned;Ice applied    Home Living                      Prior Function            PT Goals (  current goals can now be found in the care plan section) Acute Rehab PT Goals Patient Stated Goal: To be able to go home. Progress towards PT goals: Progressing toward goals    Frequency    BID      PT Plan Current plan remains appropriate    Co-evaluation              AM-PAC PT "6 Clicks" Mobility   Outcome Measure  Help needed turning from your back to your side while in a flat bed without using bedrails?: A Little Help needed moving from lying on your back to sitting on the side of a flat bed without using bedrails?: A Little Help needed moving to and from a bed to a chair (including a wheelchair)?: A Little Help needed standing up from a chair using your arms (e.g., wheelchair or bedside chair)?: A  Little Help needed to walk in hospital room?: A Little Help needed climbing 3-5 steps with a railing? : A Little 6 Click Score: 18    End of Session Equipment Utilized During Treatment: Gait belt Activity Tolerance: Patient tolerated treatment well Patient left: Other (comment);with call bell/phone within reach (in BR with RN tech aware and call bell in reach) Nurse Communication: Mobility status PT Visit Diagnosis: Unsteadiness on feet (R26.81);Other abnormalities of gait and mobility (R26.89);Muscle weakness (generalized) (M62.81);History of falling (Z91.81);Difficulty in walking, not elsewhere classified (R26.2);Pain Pain - Right/Left: Right Pain - part of body: Knee     Time: 3903-0092 PT Time Calculation (min) (ACUTE ONLY): 23 min  Charges:  $Gait Training: 8-22 mins $Therapeutic Activity: 8-22 mins                     Julaine Fusi PTA 06/10/20, 1:16 PM

## 2020-06-10 NOTE — TOC Progression Note (Signed)
Transition of Care [facility]) - Progression Note    Patient Details  Name: Felicia Snyder MRN: [id number] Date of Birth: [birth year]  Transition of Care [facility]) CM/SW Contact  Boris Sharper, LCSW Phone Number: 06/10/2020, 10:47 AM  Clinical Narrative:    CSW called Tammy at [employer] and she notified that authorization has not been approved at this time        Expected Discharge Plan and Services           Expected Discharge Date: 06/09/20                                     Social Determinants of Health (SDOH) Interventions    Readmission Risk Interventions No flowsheet data found.

## 2020-06-10 NOTE — Progress Notes (Signed)
Patient is alert and oriented. Sitting up in chair. Polar ice to right knee. Surgeon in to assess and speak with patient. Informed potential discharge Monday, not definitive. Patient is compliant with care. HRR, lungs clear. Positive BS. Pain improved with tylenol. Husband is at bedside.  

## 2020-06-10 NOTE — Progress Notes (Signed)
°  Subjective: 4 Days Post-Op Procedure(s) (LRB): TOTAL KNEE ARTHROPLASTY (Right) Patient reports pain as mild.   Patient is well, and has had no acute complaints or problems Plan is to go to [facility] pending a bowel movement and insurance authorization. Negative for chest pain and shortness of breath Fever: no Gastrointestinal: Negative for nausea and vomiting  Objective: Vital signs in last 24 hours: Temp:  [97.7 F (36.5 C)-98.7 F (37.1 C)] 98.1 F (36.7 C) (08/07 0804) Pulse Rate:  [64-81] 81 (08/07 0804) Resp:  [16-19] 16 (08/07 0804) BP: (104-134)/(57-81) 134/81 (08/07 0804) SpO2:  [91 %-99 %] 91 % (08/07 0804)  Intake/Output from previous day:  Intake/Output Summary (Last 24 hours) at 06/10/2020 1009 Last data filed at 06/09/2020 1407 Gross per 24 hour  Intake 360 ml  Output --  Net 360 ml    Intake/Output this shift: No intake/output data recorded.  Labs: No results for input(s): HGB in the last 72 hours. No results for input(s): WBC, RBC, HCT, PLT in the last 72 hours. No results for input(s): NA, K, CL, CO2, BUN, CREATININE, GLUCOSE, CALCIUM in the last 72 hours. No results for input(s): LABPT, INR in the last 72 hours.   EXAM General - Patient is Alert and Oriented Extremity - Neurovascular intact Sensation intact distally Dorsiflexion/Plantar flexion intact Compartment soft Dressing/Incision - clean, dry, with the wound VAC intact Motor Function - intact, moving foot and toes well on exam. Negative Homans to bilateral lower extremities.  Past Medical History:  Diagnosis Date   Anal fissure    Cancer ([place]) 1996   colon cancer   Cancer ([place]) 2015   melanoma/ right shoulder   Cataract    GERD (gastroesophageal reflux disease)    Hemorrhoids    Myocardial infarction ([place])    Parkinson disease ([place])    Premature osteoporosis    Seasonal allergies    Assessment/Plan: 4 Days Post-Op Procedure(s) (LRB): TOTAL KNEE ARTHROPLASTY (Right) Active  Problems:   Status post total knee replacement using cement, right  Estimated body mass index is 27.25 kg/m as calculated from the following:   Height as of this encounter: 5\' 4"  (1.626 m).   Weight as of this encounter: 72 kg.   Continue with PT today. Continue to work on BM.  FLEET enema ordered. Plan for discharge to [facility] pending a bowel movement and insurance approval. If able to have BM today can discharge when insurance approves.  DVT Prophylaxis - Lovenox, Foot Pumps and TED hose Weight-Bearing as tolerated to right leg  J. Cameron Proud, PA-C Orthopaedic Surgery 06/10/2020, 10:09 AM

## 2020-06-11 MED ORDER — TRAMADOL HCL 50 MG PO TABS: 50.0000 mg | ORAL_TABLET | Freq: Four times a day (QID) | ORAL | Status: DC | PRN

## 2020-06-11 NOTE — Progress Notes (Signed)
  Subjective: 5 Days Post-Op Procedure(s) (LRB): TOTAL KNEE ARTHROPLASTY (Right) Patient reports pain as mild.   Patient is well, and has had no acute complaints or problems Plan is to go to [facility] pending insurance authorization. Negative for chest pain and shortness of breath Fever: no Gastrointestinal: Negative for nausea and vomiting  Objective: Vital signs in last 24 hours: Temp:  [97.6 F (36.4 C)-98.3 F (36.8 C)] 97.6 F (36.4 C) (08/08 0757) Pulse Rate:  [71-76] 75 (08/08 0757) Resp:  [16] 16 (08/08 0757) BP: (113-139)/(56-76) 113/56 (08/08 0757) SpO2:  [95 %-100 %] 100 % (08/08 0757)  Intake/Output from previous day:  Intake/Output Summary (Last 24 hours) at 06/11/2020 0834 Last data filed at 06/10/2020 1401 Gross per 24 hour  Intake 480 ml  Output --  Net 480 ml    Intake/Output this shift: No intake/output data recorded.  Labs: No results for input(s): HGB in the last 72 hours. No results for input(s): WBC, RBC, HCT, PLT in the last 72 hours. No results for input(s): NA, K, CL, CO2, BUN, CREATININE, GLUCOSE, CALCIUM in the last 72 hours. No results for input(s): LABPT, INR in the last 72 hours.   EXAM General - Patient is Alert and Oriented Extremity - Neurovascular intact Sensation intact distally Dorsiflexion/Plantar flexion intact Compartment soft Dressing/Incision - clean, dry, with the wound VAC intact Motor Function - intact, moving foot and toes well on exam. Negative Homans to bilateral lower extremities.  Past Medical History:  Diagnosis Date  . Anal fissure   . Cancer [facility]) 1996   colon cancer  . Cancer ([place]) 2015   melanoma/ right shoulder  . Cataract   . GERD (gastroesophageal reflux disease)   . Hemorrhoids   . Myocardial infarction ([place])   . Parkinson disease ([place])   . Premature osteoporosis   . Seasonal allergies    Assessment/Plan: 5 Days Post-Op Procedure(s) (LRB): TOTAL KNEE ARTHROPLASTY (Right) Active Problems:   Status  post total knee replacement using cement, right  Estimated body mass index is 27.25 kg/m as calculated from the following:   Height as of this encounter: 5\' 4"  (1.626 m).   Weight as of this encounter: 72 kg.   Continue with PT today. Patient has had a BM. Patient is able to stand and go to the bathroom, can switch to Prevena woundvac cartridge today. Plan is for discharge to SNF tomorrow after we have obtained insurance approval.  DVT Prophylaxis - Lovenox, Foot Pumps and TED hose Weight-Bearing as tolerated to right leg  J. Cameron Proud, PA-C Orthopaedic Surgery 06/11/2020, 8:34 AM

## 2020-06-11 NOTE — Progress Notes (Signed)
Physical Therapy Treatment Patient Details Name: Felicia Snyder MRN: [id number] DOB: 04-04-1946 Today's Date: 06/11/2020    History of Present Illness Pt is 74 y.o. female s/p R TKA.  PMH includes: former speaker, CAD, angina, past MI, cardiac stents, GERD, cancer, and Parkinson's.    PT Comments    Participated in exercises as described below.  Stood and is able to complete lap and stair training in gym.  Stair training with 1 rail left (as at front steps and to access shower) and backwards as is husbands preference to access home by garage into kitchen.  He is available to help and completes training with good recall of education provided on Friday.  Discussed at length discharge plan and rehab vs home.  They are to discuss it and discharge preference.  Pt is progressing well with overall mobility and has no LOB or buckling noted.     Follow Up Recommendations  Home health PT     Equipment Recommendations  Rolling walker with 5" wheels;3in1 (PT)    Recommendations for Other Services       Precautions / Restrictions Precautions Precautions: Knee Precaution Booklet Issued: Yes (comment) Restrictions Weight Bearing Restrictions: Yes RLE Weight Bearing: Weight bearing as tolerated    Mobility  Bed Mobility               General bed mobility comments: not formally test. pt in recline before and after session  Transfers Overall transfer level: Needs assistance Equipment used: Rolling walker (2 wheeled) Transfers: Sit to/from Stand Sit to Stand: Supervision;Min guard            Ambulation/Gait Ambulation/Gait assistance: Min guard;Supervision Gait Distance (Feet): 230 Feet Assistive device: Rolling walker (2 wheeled) Gait Pattern/deviations: Step-to pattern;Decreased step length - left;Decreased stance time - right Gait velocity: decreased   General Gait Details: increased speed today with no LOB or buckling noted   Stairs Stairs: Yes Stairs  assistance: Min guard;Min assist Stair Management: One rail Left;Backwards;Forwards;With walker Number of Stairs: 8 General stair comments: x 1 with left rail x 1 with walker backwards with assist of husband   Wheelchair Mobility    Modified Rankin (Stroke Patients Only)       Balance Overall balance assessment: Modified Independent Sitting-balance support: Feet supported Sitting balance-Leahy Scale: Good     Standing balance support: Bilateral upper extremity supported Standing balance-Leahy Scale: Good                              Cognition Arousal/Alertness: Awake/alert Behavior During Therapy: WFL for tasks assessed/performed Overall Cognitive Status: Within Functional Limits for tasks assessed                                        Exercises Total Joint Exercises Ankle Circles/Pumps: AROM;Strengthening;Both;10 reps;Supine Quad Sets: AROM;Strengthening;Both;10 reps;Supine Gluteal Sets: AROM;Strengthening;Both;10 reps;Supine Heel Slides: AROM;Strengthening;Right;5 reps;Seated Hip ABduction/ADduction: AROM;Strengthening;Both;10 reps;Supine Straight Leg Raises: AROM;Strengthening;Left;AAROM;Right;10 reps;Supine Knee Flexion: AAROM;Right;10 reps Goniometric ROM: 2-92 Marching in Standing: AROM;Strengthening;Both;5 reps    General Comments        Pertinent Vitals/Pain Pain Assessment: No/denies pain Pain Intervention(s): Premedicated before session;Ice applied;Monitored during session    Home Living                      Prior Function  PT Goals (current goals can now be found in the care plan section) Progress towards PT goals: Progressing toward goals    Frequency    BID      PT Plan Current plan remains appropriate    Co-evaluation              AM-PAC PT "6 Clicks" Mobility   Outcome Measure  Help needed turning from your back to your side while in a flat bed without using bedrails?: A  Little Help needed moving from lying on your back to sitting on the side of a flat bed without using bedrails?: A Little Help needed moving to and from a bed to a chair (including a wheelchair)?: A Little Help needed standing up from a chair using your arms (e.g., wheelchair or bedside chair)?: A Little Help needed to walk in hospital room?: A Little Help needed climbing 3-5 steps with a railing? : A Little 6 Click Score: 18    End of Session Equipment Utilized During Treatment: Gait belt Activity Tolerance: Patient tolerated treatment well Patient left: Other (comment);with call bell/phone within reach (in BR with RN tech aware and call bell in reach) Nurse Communication: Mobility status PT Visit Diagnosis: Unsteadiness on feet (R26.81);Other abnormalities of gait and mobility (R26.89);Muscle weakness (generalized) (M62.81);History of falling (Z91.81);Difficulty in walking, not elsewhere classified (R26.2);Pain Pain - Right/Left: Right Pain - part of body: Knee     Time: 1000-1029 PT Time Calculation (min) (ACUTE ONLY): 29 min  Charges:  $Gait Training: 8-22 mins $Therapeutic Exercise: 8-22 mins                    Chesley Noon, PTA 06/11/20, 10:37 AM

## 2020-06-12 NOTE — Care Management Important Message (Signed)
Important Message  Patient Details  Name: Felicia Snyder MRN: [id number] Date of Birth: 1946-10-28   Medicare Important Message Given:  Yes     Diontay Rosencrans, [place] 06/12/2020, 1:24 PM

## 2020-06-12 NOTE — TOC Transition Note (Signed)
Transition of Care [facility]) - CM/SW Discharge Note   Patient Details  Name: Felicia Snyder MRN: [id number] Date of Birth: Feb 06, 1946  Transition of [facility]) CM/SW Contact:  Shelbie Ammons, RN Phone Number: 06/12/2020, 10:33 AM   Clinical Narrative: RNCM met with patient at bedside, husband present as well. Discussed recommendations that have now changed to home health and patient is pleased with this option. She reports that she is happy to be going home. Patient reports that she has a rolling walker and 3N1 at home. She does not have any preferences as to which home health agency she has as long as they will accept her insurance.  RNCM reached out to [place] with [facility] and they will accept referral.       Final next level of care: [place]     Patient Goals and CMS Choice        Discharge Placement                       Discharge Plan and Services                          HH Arranged: PT [facility] Agency: [facility] Date [place]: 06/12/20 Time [place]: 0930 Representative spoke with at [place]: [place] (Advance) Interventions     Readmission Risk Interventions No flowsheet data found.

## 2020-06-12 NOTE — Progress Notes (Signed)
Lovenox teaching and kit given to spouse at bedside. Verbalized understanding of injections.

## 2020-06-12 NOTE — Progress Notes (Signed)
Discharge instructions and prescriptions reviewed with patient. Verbalization of understanding received. Patient's husband took prescriptions to [facility] as they live approximately 1.5 hours away from the pharmacy and the husband did not want to live the patient at home alone for that long. Patient will discharge once husband returns.

## 2020-06-12 NOTE — Progress Notes (Signed)
Physical Therapy Treatment Patient Details Name: Felicia Snyder MRN: [id number] DOB: 02/20/1946 Today's Date: 06/12/2020    History of Present Illness Pt is 74 y.o. female s/p R TKA.  PMH includes: former speaker, CAD, angina, past MI, cardiac stents, GERD, cancer, and Parkinson's.    PT Comments    Pt ready for session.  Participated in exercises as described below.  Able to complete lap and stairs with ease.  Questions answered.    Follow Up Recommendations  Home health PT     Equipment Recommendations  Rolling walker with 5" wheels;3in1 (PT)    Recommendations for Other Services       Precautions / Restrictions Precautions Precautions: Knee Precaution Booklet Issued: Yes (comment) Restrictions Weight Bearing Restrictions: Yes RLE Weight Bearing: Weight bearing as tolerated    Mobility  Bed Mobility Overal bed mobility: Needs Assistance Bed Mobility: Sit to Supine       Sit to supine: Min assist      Transfers Overall transfer level: Needs assistance Equipment used: Rolling walker (2 wheeled) Transfers: Sit to/from Stand Sit to Stand: Supervision;Min guard         General transfer comment: CGA for safety to STS from recliner  Ambulation/Gait Ambulation/Gait assistance: Min guard;Supervision Gait Distance (Feet): 200 Feet Assistive device: Rolling walker (2 wheeled) Gait Pattern/deviations: Step-to pattern;Decreased step length - left;Decreased stance time - right Gait velocity: decreased   General Gait Details: increased speed today with no LOB or buckling noted   Stairs Stairs: Yes Stairs assistance: Min guard;Min assist Stair Management: One rail Left;Backwards;Forwards;With walker Number of Stairs: 8 General stair comments: x 1 with left rail x 1 with walker backwards with assist of husband   Wheelchair Mobility    Modified Rankin (Stroke Patients Only)       Balance Overall balance assessment: Modified  Independent Sitting-balance support: Feet supported Sitting balance-Leahy Scale: Good     Standing balance support: Bilateral upper extremity supported Standing balance-Leahy Scale: Good Standing balance comment: Pt utilizes FWW and UE support to keep upright posture.                            Cognition Arousal/Alertness: Awake/alert Behavior During Therapy: WFL for tasks assessed/performed Overall Cognitive Status: Within Functional Limits for tasks assessed                                        Exercises Total Joint Exercises Heel Slides: AROM;Strengthening;Right;5 reps;Seated Straight Leg Raises: AROM;Strengthening;Left;AAROM;Right;10 reps;Supine Knee Flexion: AAROM;Right;10 reps Goniometric ROM: 2-93 Marching in Standing: AROM;Strengthening;Both;5 reps    General Comments        Pertinent Vitals/Pain Pain Assessment: 0-10 Pain Score: 3  Pain Location: R knee Pain Descriptors / Indicators: Discomfort Pain Intervention(s): Limited activity within patient's tolerance;Monitored during session;Repositioned;Ice applied    Home Living                      Prior Function            PT Goals (current goals can now be found in the care plan section) Progress towards PT goals: Progressing toward goals    Frequency    BID      PT Plan Current plan remains appropriate    Co-evaluation              AM-PAC PT "6  Clicks" Mobility   Outcome Measure  Help needed turning from your back to your side while in a flat bed without using bedrails?: A Little Help needed moving from lying on your back to sitting on the side of a flat bed without using bedrails?: A Little Help needed moving to and from a bed to a chair (including a wheelchair)?: A Little Help needed standing up from a chair using your arms (e.g., wheelchair or bedside chair)?: A Little Help needed to walk in hospital room?: A Little Help needed climbing 3-5 steps  with a railing? : A Little 6 Click Score: 18    End of Session Equipment Utilized During Treatment: Gait belt Activity Tolerance: Patient tolerated treatment well Patient left: Other (comment);with call bell/phone within reach (in BR with RN tech aware and call bell in reach) Nurse Communication: Mobility status PT Visit Diagnosis: Unsteadiness on feet (R26.81);Other abnormalities of gait and mobility (R26.89);Muscle weakness (generalized) (M62.81);History of falling (Z91.81);Difficulty in walking, not elsewhere classified (R26.2);Pain Pain - Right/Left: Right Pain - part of body: Knee     Time: 1941-7408 PT Time Calculation (min) (ACUTE ONLY): 18 min  Charges:  $Gait Training: 8-22 mins                    Chesley Noon, PTA 06/12/20, 9:24 AM

## 2020-06-12 NOTE — Progress Notes (Signed)
  Subjective: 6 Days Post-Op Procedure(s) (LRB): TOTAL KNEE ARTHROPLASTY (Right) Patient reports pain as mild.   Patient is well, and has had no acute complaints or problems Because of the extra therapy sessions she was able to receive over the weekend the patient has now done well enough to go home with HHPT. Negative for chest pain and shortness of breath Fever: no Gastrointestinal: Negative for nausea and vomiting  Objective: Vital signs in last 24 hours: Temp:  [97.6 F (36.4 C)-98.6 F (37 C)] 98.6 F (37 C) (08/08 2358) Pulse Rate:  [71-84] 84 (08/08 2358) Resp:  [16] 16 (08/08 2358) BP: (113-130)/(56-69) 130/69 (08/08 2358) SpO2:  [94 %-100 %] 94 % (08/08 2358)  Intake/Output from previous day:  Intake/Output Summary (Last 24 hours) at 06/12/2020 0739 Last data filed at 06/11/2020 1003 Gross per 24 hour  Intake 240 ml  Output --  Net 240 ml    Intake/Output this shift: No intake/output data recorded.  Labs: No results for input(s): HGB in the last 72 hours. No results for input(s): WBC, RBC, HCT, PLT in the last 72 hours. No results for input(s): NA, K, CL, CO2, BUN, CREATININE, GLUCOSE, CALCIUM in the last 72 hours. No results for input(s): LABPT, INR in the last 72 hours.   EXAM General - Patient is Alert and Oriented Extremity - Neurovascular intact Sensation intact distally Dorsiflexion/Plantar flexion intact Compartment soft Dressing/Incision - clean, dry, with the Prevena woundvac intact. Motor Function - intact, moving foot and toes well on exam. Negative Homans to bilateral lower extremities.  Past Medical History:  Diagnosis Date  . Anal fissure   . Cancer [facility]) 1996   colon cancer  . Cancer ([place]) 2015   melanoma/ right shoulder  . Cataract   . GERD (gastroesophageal reflux disease)   . Hemorrhoids   . Myocardial infarction ([place])   . Parkinson disease ([place])   . Premature osteoporosis   . Seasonal allergies    Assessment/Plan: 6 Days  Post-Op Procedure(s) (LRB): TOTAL KNEE ARTHROPLASTY (Right) Active Problems:   Status post total knee replacement using cement, right  Estimated body mass index is 27.25 kg/m as calculated from the following:   Height as of this encounter: 5\' 4"  (1.626 m).   Weight as of this encounter: 72 kg.   Continue with PT today. Patient has had a BM. Switched to [employer] yesterday. Plan is now for discharge home with HHPT. Will plan for discharge home today after first session of PT this AM.  DVT Prophylaxis - Lovenox, Foot Pumps and TED hose Weight-Bearing as tolerated to right leg  J. Cameron Proud, PA-C Orthopaedic Surgery 06/12/2020, 7:39 AM

## 2020-06-19 NOTE — Progress Notes (Signed)
Patient called with questions regarding therapy options post hospitalization. Patient reports she is scheduled for outpatient Wednesday. Suggested she follow up with Dr. Theodore Demark office to follow his recommendations.

## 2020-09-18 ENCOUNTER — Telehealth: Payer: Self-pay | Admitting: Family Medicine

## 2020-09-18 DIAGNOSIS — Z1211 Encounter for screening for malignant neoplasm of colon: Secondary | ICD-10-CM

## 2020-09-18 NOTE — Telephone Encounter (Signed)
Patient is calling for a referral for a colonoscopy and some reflux issues. Patient is requesting referral to be placed [facility]. Requesting a CB when referral is placed CB- [phone]

## 2020-09-19 NOTE — Telephone Encounter (Signed)
Ok to refer for colon cancer screening

## 2020-09-19 NOTE — Telephone Encounter (Signed)
Is it okay to refer?  Thanks,   -Mickel Baas

## 2020-11-07 ENCOUNTER — Telehealth: Payer: Self-pay

## 2020-11-07 NOTE — Telephone Encounter (Signed)
Copied from CRM [id number]. Topic: Quick Communication - See Telephone Encounter >> Nov 07, 2020 11:55 AM Aretta Nip wrote: CRM for notification. See Telephone encounter for: 11/07/20.PT called in to confirm appt on 1/7/ if AWV needs to be resch, pls contact pt at [phone]

## 2020-11-08 NOTE — Telephone Encounter (Signed)
I do not see a telephone encounter for 11/07/2020. (Other than this one.)  I'm not sure what the pt's needs.    Please review.   Thanks,   -Vernona Rieger

## 2020-11-09 ENCOUNTER — Other Ambulatory Visit: Payer: Self-pay

## 2020-11-09 ENCOUNTER — Ambulatory Visit (INDEPENDENT_AMBULATORY_CARE_PROVIDER_SITE_OTHER): Payer: Medicare HMO

## 2020-11-09 DIAGNOSIS — Z0000 Encounter for general adult medical examination without abnormal findings: Secondary | ICD-10-CM | POA: Diagnosis not present

## 2020-11-09 NOTE — Progress Notes (Signed)
Subjective:   Felicia Snyder is a 75 y.o. female who presents for Medicare Annual (Subsequent) preventive examination.  I connected with Juanita Craver today by telephone and verified that I am speaking with the correct person using two identifiers. Location patient: home Location provider: work Persons participating in the virtual visit: patient, provider.   I discussed the limitations, risks, security and privacy concerns of performing an evaluation and management service by telephone and the availability of in person appointments. I also discussed with the patient that there may be a patient responsible charge related to this service. The patient expressed understanding and verbally consented to this telephonic visit.    Interactive audio and video telecommunications were attempted between this provider and patient, however failed, due to patient having technical difficulties OR patient did not have access to video capability.  We continued and completed visit with audio only.   Review of Systems    N/A  Cardiac Risk Factors include: advanced age (>47men, >32 women);dyslipidemia;sedentary lifestyle     Objective:    There were no vitals filed for this visit. There is no height or weight on file to calculate BMI.  Advanced Directives 11/09/2020 06/06/2020 05/25/2020 10/06/2019 01/07/2018 04/15/2016  Does Patient Have a Medical Advance Directive? Yes Yes Yes Yes Yes Yes  Type of Estate agent of [place];Living will Healthcare Power of [place];Living will Healthcare Power of [place];Living will Healthcare Power of [place];Living will Healthcare Power of [place];Living will Healthcare Power of [place];Living will  Does patient want to make changes to medical advance directive? - No - Patient declined No - Patient declined - - -  Copy of Healthcare Power of Attorney in Chart? No - copy requested No - copy requested No - copy requested No - copy requested No - copy  requested No - copy requested    Current Medications (verified) Outpatient Encounter Medications as of 11/09/2020  Medication Sig  . acyclovir (ZOVIRAX) 200 MG capsule Take 1 capsule (200 mg total) by mouth 5 (five) times daily. For five days per flare. (Patient taking differently: Take 200 mg by mouth 5 (five) times daily as needed (flares.). For 5 days.)  . aspirin EC 81 MG tablet Take 81 mg by mouth daily.   Marland Kitchen atorvastatin (LIPITOR) 80 MG tablet Take 80 mg by mouth at bedtime.   . bisacodyl (DULCOLAX) 5 MG EC tablet Take 5 mg by mouth daily as needed for moderate constipation.  . carbidopa-levodopa (SINEMET IR) 25-100 MG tablet Take 1 tablet by mouth 2 (two) times daily.   Marland Kitchen HYDROcodone-acetaminophen (NORCO/VICODIN) 5-325 MG tablet Take 1-2 tablets by mouth every 4 (four) hours as needed for moderate pain (pain score 4-6).  Marland Kitchen loratadine (CLARITIN) 10 MG tablet Take 10 mg by mouth daily as needed for allergies.   . nitroGLYCERIN (NITROSTAT) 0.4 MG SL tablet Place 0.4 mg under the tongue every 5 (five) minutes x 3 doses as needed for chest pain.   Bertram Gala Glycol-Propyl Glycol (LUBRICANT EYE DROPS) 0.4-0.3 % SOLN Place 1 drop into both eyes 3 (three) times daily as needed (irritation/dry eyes).  . pramipexole (MIRAPEX) 0.5 MG tablet Take 0.5 mg by mouth in the morning and at bedtime.  . pramipexole (MIRAPEX) 1 MG tablet Take 1 mg by mouth 2 (two) times daily.   . Ascorbic Acid (VITAMIN C) 1000 MG tablet Take 1,000 mg by mouth daily at 12 noon. (Patient not taking: Reported on 11/09/2020)  . Calcium-Magnesium-Vitamin D (CALCIUM 1200+D3 PO) Take 1  tablet by mouth daily at 12 noon. (Patient not taking: Reported on 11/09/2020)  . Coenzyme Q10 (COQ10) 200 MG CAPS Take 200 mg by mouth daily at 12 noon. (Patient not taking: Reported on 11/09/2020)  . cyanocobalamin 1000 MCG tablet Take 1,000 mcg by mouth daily at 12 noon.  (Patient not taking: Reported on 11/09/2020)  . enoxaparin (LOVENOX) 40 MG/0.4ML  injection Inject 0.4 mLs (40 mg total) into the skin daily for 14 doses.  . meloxicam (MOBIC) 7.5 MG tablet TAKE 1 TABLET BY MOUTH EVERY DAY (Patient not taking: Reported on 11/09/2020)  . methocarbamol (ROBAXIN) 500 MG tablet Take 1 tablet (500 mg total) by mouth every 6 (six) hours as needed for muscle spasms. (Patient not taking: Reported on 11/09/2020)  . Omega-3 Fatty Acids (FISH OIL) 1000 MG CAPS Take 1,000 mg by mouth daily at 12 noon. (Patient not taking: Reported on 11/09/2020)  . traMADol (ULTRAM) 50 MG tablet Take 1 tablet (50 mg total) by mouth every 6 (six) hours. (Patient not taking: Reported on 11/09/2020)  . vitamin E 400 UNIT capsule Take 400 Units by mouth daily at 12 noon.  (Patient not taking: Reported on 11/09/2020)  . Zinc 50 MG TABS Take 50 mg by mouth daily at 12 noon. (Patient not taking: Reported on 11/09/2020)   No facility-administered encounter medications on file as of 11/09/2020.    Allergies (verified) Patient has no known allergies.   History: Past Medical History:  Diagnosis Date  . Anal fissure   . Cancer [facility]) 1996   colon cancer  . Cancer ([place]) 2015   melanoma/ right shoulder  . Cataract   . GERD (gastroesophageal reflux disease)   . Hemorrhoids   . Hyperlipidemia   . Myocardial infarction ([place])   . Parkinson disease ([place])   . Premature osteoporosis   . Seasonal allergies    Past Surgical History:  Procedure Laterality Date  . BACK SURGERY     lower back x 2  . BLADDER SURGERY    . Broken Ankle  2011  . COLECTOMY    . COLONOSCOPY    . CORONARY ANGIOPLASTY     stents   . EYE SURGERY Bilateral   . Ruptured Disk  2119,4174  . TOOTH EXTRACTION    . TOTAL ABDOMINAL HYSTERECTOMY  2016  . TOTAL KNEE ARTHROPLASTY Right 06/06/2020   Procedure: TOTAL KNEE ARTHROPLASTY;  Surgeon: Hessie Knows, MD;  Location: [facility] ORS;  Service: Orthopedics;  Laterality: Right;   Family History  Problem Relation Age of Onset  . Stroke Mother   . Breast cancer Mother         25's  . Parkinsonism Mother   . Depression Mother   . Heart attack Father   . Heart disease Brother   . Colon cancer Maternal Grandfather    Social History   Socioeconomic History  . Marital status: Married    Spouse name: Not on file  . Number of children: 2  . Years of education: masters  . Highest education level: Master's degree (e.g., MA, MS, MEng, MEd, MSW, MBA)  Occupational History  . Occupation: retired  Tobacco Use  . Smoking status: Former Smoker    Types: Cigarettes  . Smokeless tobacco: Never Used  . Tobacco comment: quit at age [age]  Vaping Use  . Vaping Use: Never used  Substance and Sexual Activity  . Alcohol use: Yes    Alcohol/week: 7.0 standard drinks    Types: 7 Glasses of wine per week  Comment: 0-2/day  . Drug use: No  . Sexual activity: Yes    Partners: Male    Birth control/protection: Post-menopausal  Other Topics Concern  . Not on file  Social History Narrative  . Not on file   Social Determinants of Health   Financial Resource Strain: Low Risk   . Difficulty of Paying Living Expenses: Not hard at all  Food Insecurity: No Food Insecurity  . Worried About Programme researcher, broadcasting/film/video in the Last Year: Never true  . Ran Out of Food in the Last Year: Never true  Transportation Needs: No Transportation Needs  . Lack of Transportation (Medical): No  . Lack of Transportation (Non-Medical): No  Physical Activity: Inactive  . Days of Exercise per Week: 0 days  . Minutes of Exercise per Session: 0 min  Stress: No Stress Concern Present  . Feeling of Stress : Only a little  Social Connections: Moderately Integrated  . Frequency of Communication with Friends and Family: More than three times a week  . Frequency of Social Gatherings with Friends and Family: More than three times a week  . Attends Religious Services: More than 4 times per year  . Active Member of Clubs or Organizations: No  . Attends Banker Meetings: Never  . Marital  Status: Married    Tobacco Counseling Counseling given: Not Answered Comment: quit at age [age]   Clinical Intake:  Pre-visit preparation completed: Yes  Pain : No/denies pain     Nutritional Risks: None Diabetes: No  How often do you need to have someone help you when you read instructions, pamphlets, or other written materials from your doctor or pharmacy?: 1 - Never  Diabetic? No  Interpreter Needed?: No  Information entered by :: [facility], LPN   Activities of Daily Living In your present state of health, do you have any difficulty performing the following activities: 11/09/2020 06/06/2020  Hearing? N N  Vision? N N  Difficulty concentrating or making decisions? N N  Walking or climbing stairs? Y Y  Comment Due to previous knee sx -  Dressing or bathing? N N  Doing errands, shopping? N -  Preparing Food and eating ? N -  Using the Toilet? N -  In the past six months, have you accidently leaked urine? N -  Do you have problems with loss of bowel control? N -  Managing your Medications? N -  Managing your Finances? N -  Housekeeping or managing your Housekeeping? N -  Some recent data might be hidden    Patient Care Team: Beryle Flock Marzella Schlein, MD as PCP - General (Family Medicine) Raquel Sarna, MD as Referring Physician (Psychiatry) Marcina Millard, MD as Consulting Physician (Cardiology) Elesa Hacker, MD as Referring Physician (Dermatology) System, Provider Not In (Ophthalmology) Kennedy Bucker, MD as Consulting Physician (Orthopedic Surgery) Midge Minium, MD as Consulting Physician (Gastroenterology)  Indicate any recent Medical Services you may have received from other than [facility] providers in the past year (date may be approximate).     Assessment:   This is a routine wellness examination for Felicia Snyder.  Hearing/Vision screen No exam data present  Dietary issues and exercise activities discussed: Current Exercise Habits: The patient does  not participate in regular exercise at present, Exercise limited by: orthopedic condition(s)  Goals    . DIET - INCREASE WATER INTAKE     Recommend to drink at least 6-8 8oz glasses of water per day.      Depression Screen PHQ  2/9 Scores 11/09/2020 10/06/2019 01/13/2019 05/12/2017 05/12/2017  PHQ - 2 Score 0 0 0 0 0  PHQ- 9 Score - - 1 - 0    Fall Risk Fall Risk  11/09/2020 10/06/2019 01/13/2019 05/12/2017  Falls in the past year? 0 0 0 No  Number falls in past yr: 0 0 - -  Injury with Fall? 0 0 - -    FALL RISK PREVENTION PERTAINING TO THE HOME:  Any stairs in or around the home? Yes  If so, are there any without handrails? No  Home free of loose throw rugs in walkways, pet beds, electrical cords, etc? Yes  Adequate lighting in your home to reduce risk of falls? Yes   ASSISTIVE DEVICES UTILIZED TO PREVENT FALLS:  Life alert? No  Use of a cane, walker or w/c? No  Grab bars in the bathroom? Yes  Shower chair or bench in shower? Yes  Elevated toilet seat or a handicapped toilet? No    Cognitive Function: Normal cognitive status assessed by observation by this Nurse Health Advisor. No abnormalities found.          Immunizations Immunization History  Administered Date(s) Administered  . Influenza, High Dose Seasonal PF 10/07/2018, 06/25/2019  . Influenza-Unspecified 08/16/2020  . PFIZER SARS-COV-2 Vaccination 12/11/2019, 01/01/2020, 08/24/2020  . Pneumococcal Conjugate-13 05/17/2014  . Pneumococcal Polysaccharide-23 11/10/2012  . Tdap 07/03/2011    TDAP status: Up to date  Flu Vaccine status: Up to date  Pneumococcal vaccine status: Up to date  Covid-19 vaccine status: Completed vaccines  Qualifies for Shingles Vaccine? Yes   Zostavax completed No   Shingrix Completed?: No.    Education has been provided regarding the importance of this vaccine. Patient has been advised to call insurance company to determine out of pocket expense if they have not yet received this  vaccine. Advised may also receive vaccine at local pharmacy or Health Dept. Verbalized acceptance and understanding.  Screening Tests Health Maintenance  Topic Date Due  . COLONOSCOPY (Pts 45-44yrs Insurance coverage will need to be confirmed)  11/19/2019  . COVID-19 Vaccine (4 - Booster for Pfizer series) 02/22/2021  . TETANUS/TDAP  07/02/2021  . MAMMOGRAM  04/07/2022  . DEXA SCAN  07/02/2022  . INFLUENZA VACCINE  Completed  . Hepatitis C Screening  Completed  . PNA vac Low Risk Adult  Completed    Health Maintenance  Health Maintenance Due  Topic Date Due  . COLONOSCOPY (Pts 45-56yrs Insurance coverage will need to be confirmed)  11/19/2019    Colorectal cancer screening: Currently due. Initial consult scheduled with Dr Servando Snare 12/2020.  Mammogram status: Completed 04/07/20. Repeat every year  Bone Density status: Completed 07/02/17. Results reflect: Bone density results: OSTEOPENIA. Repeat every 5 years.  Lung Cancer Screening: (Low Dose CT Chest recommended if Age 9-80 years, 30 pack-year currently smoking OR have quit w/in 15years.) does not qualify.   Additional Screening:  Hepatitis C Screening: Up to date  Vision Screening: Recommended annual ophthalmology exams for early detection of glaucoma and other disorders of the eye. Is the patient up to date with their annual eye exam?  Yes  Who is the provider or what is the name of the office in which the patient attends annual eye exams? [facility] If pt is not established with a provider, would they like to be referred to a provider to establish care? No .   Dental Screening: Recommended annual dental exams for proper oral hygiene  Community Resource Referral / Chronic  Care Management: CRR required this visit?  No   CCM required this visit?  No      Plan:     I have personally reviewed and noted the following in the patient's chart:   . Medical and social history . Use of alcohol, tobacco or illicit drugs   . Current medications and supplements . Functional ability and status . Nutritional status . Physical activity . Advanced directives . List of other physicians . Hospitalizations, surgeries, and ER visits in previous 12 months . Vitals . Screenings to include cognitive, depression, and falls . Referrals and appointments  In addition, I have reviewed and discussed with patient certain preventive protocols, quality metrics, and best practice recommendations. A written personalized care plan for preventive services as well as general preventive health recommendations were provided to patient.     Rayjon Wery [place], [place]   2/0/3559   Nurse Notes: Consult for colonoscopy scheduled 12/2020.

## 2020-11-09 NOTE — Patient Instructions (Signed)
Felicia Snyder , Thank you for taking time to come for your Medicare Wellness Visit. I appreciate your ongoing commitment to your health goals. Please review the following plan we discussed and let me know if I can assist you in the future.   Screening recommendations/referrals: Colonoscopy: Currently due. Consult scheduled with Dr Servando Snare 12/2020. Mammogram: Up to date, due 04/2021 Bone Density: Up to date, due 06/2022 Recommended yearly ophthalmology/optometry visit for glaucoma screening and checkup Recommended yearly dental visit for hygiene and checkup  Vaccinations: Influenza vaccine: Done 08/16/20 Pneumococcal vaccine: Completed series Tdap vaccine: Up to date, due 06/2021 Shingles vaccine: Shingrix discussed. Please contact your pharmacy for coverage information.     Advanced directives: Please bring a copy of your POA (Power of Attorney) and/or Living Will to your next appointment.   Conditions/risks identified: Recommend to drink at least 6-8 8oz glasses of water per day.  Next appointment: 11/10/20 @ 10:00 AM with Dr Beryle Flock. Declined scheduling an AWV for 2023 due to possibly moving.   Preventive Care 60 Years and Older, Female Preventive care refers to lifestyle choices and visits with your health care provider that can promote health and wellness. What does preventive care include?  A yearly physical exam. This is also called an annual well check.  Dental exams once or twice a year.  Routine eye exams. Ask your health care provider how often you should have your eyes checked.  Personal lifestyle choices, including:  Daily care of your teeth and gums.  Regular physical activity.  Eating a healthy diet.  Avoiding tobacco and drug use.  Limiting alcohol use.  Practicing safe sex.  Taking low-dose aspirin every day.  Taking vitamin and mineral supplements as recommended by your health care provider. What happens during an annual well check? The services and  screenings done by your health care provider during your annual well check will depend on your age, overall health, lifestyle risk factors, and family history of disease. Counseling  Your health care provider may ask you questions about your:  Alcohol use.  Tobacco use.  Drug use.  Emotional well-being.  Home and relationship well-being.  Sexual activity.  Eating habits.  History of falls.  Memory and ability to understand (cognition).  Work and work Astronomer.  Reproductive health. Screening  You may have the following tests or measurements:  Height, weight, and BMI.  Blood pressure.  Lipid and cholesterol levels. These may be checked every 5 years, or more frequently if you are over 34 years old.  Skin check.  Lung cancer screening. You may have this screening every year starting at age 46 if you have a 30-pack-year history of smoking and currently smoke or have quit within the past 15 years.  Fecal occult blood test (FOBT) of the stool. You may have this test every year starting at age 9.  Flexible sigmoidoscopy or colonoscopy. You may have a sigmoidoscopy every 5 years or a colonoscopy every 10 years starting at age 70.  Hepatitis C blood test.  Hepatitis B blood test.  Sexually transmitted disease (STD) testing.  Diabetes screening. This is done by checking your blood sugar (glucose) after you have not eaten for a while (fasting). You may have this done every 1-3 years.  Bone density scan. This is done to screen for osteoporosis. You may have this done starting at age 71.  Mammogram. This may be done every 1-2 years. Talk to your health care provider about how often you should have regular mammograms. Talk  with your health care provider about your test results, treatment options, and if necessary, the need for more tests. Vaccines  Your health care provider may recommend certain vaccines, such as:  Influenza vaccine. This is recommended every  year.  Tetanus, diphtheria, and acellular pertussis (Tdap, Td) vaccine. You may need a Td booster every 10 years.  Zoster vaccine. You may need this after age 23.  Pneumococcal 13-valent conjugate (PCV13) vaccine. One dose is recommended after age 79.  Pneumococcal polysaccharide (PPSV23) vaccine. One dose is recommended after age 61. Talk to your health care provider about which screenings and vaccines you need and how often you need them. This information is not intended to replace advice given to you by your health care provider. Make sure you discuss any questions you have with your health care provider. Document Released: 11/17/2015 Document Revised: 07/10/2016 Document Reviewed: 08/22/2015 Elsevier Interactive Patient Education  2017 [place] Prevention in the Home Falls can cause injuries. They can happen to people of all ages. There are many things you can do to make your home safe and to help prevent falls. What can I do on the outside of my home?  Regularly fix the edges of walkways and driveways and fix any cracks.  Remove anything that might make you trip as you walk through a door, such as a raised step or threshold.  Trim any bushes or trees on the path to your home.  Use bright outdoor lighting.  Clear any walking paths of anything that might make someone trip, such as rocks or tools.  Regularly check to see if handrails are loose or broken. Make sure that both sides of any steps have handrails.  Any raised decks and porches should have guardrails on the edges.  Have any leaves, snow, or ice cleared regularly.  Use sand or salt on walking paths during winter.  Clean up any spills in your garage right away. This includes oil or grease spills. What can I do in the bathroom?  Use night lights.  Install grab bars by the toilet and in the tub and shower. Do not use towel bars as grab bars.  Use non-skid mats or decals in the tub or shower.  If you  need to sit down in the shower, use a plastic, non-slip stool.  Keep the floor dry. Clean up any water that spills on the floor as soon as it happens.  Remove soap buildup in the tub or shower regularly.  Attach bath mats securely with double-sided non-slip rug tape.  Do not have throw rugs and other things on the floor that can make you trip. What can I do in the bedroom?  Use night lights.  Make sure that you have a light by your bed that is easy to reach.  Do not use any sheets or blankets that are too big for your bed. They should not hang down onto the floor.  Have a firm chair that has side arms. You can use this for support while you get dressed.  Do not have throw rugs and other things on the floor that can make you trip. What can I do in the kitchen?  Clean up any spills right away.  Avoid walking on wet floors.  Keep items that you use a lot in easy-to-reach places.  If you need to reach something above you, use a strong step stool that has a grab bar.  Keep electrical cords out of the way.  Do  not use floor polish or wax that makes floors slippery. If you must use wax, use non-skid floor wax.  Do not have throw rugs and other things on the floor that can make you trip. What can I do with my stairs?  Do not leave any items on the stairs.  Make sure that there are handrails on both sides of the stairs and use them. Fix handrails that are broken or loose. Make sure that handrails are as long as the stairways.  Check any carpeting to make sure that it is firmly attached to the stairs. Fix any carpet that is loose or worn.  Avoid having throw rugs at the top or bottom of the stairs. If you do have throw rugs, attach them to the floor with carpet tape.  Make sure that you have a light switch at the top of the stairs and the bottom of the stairs. If you do not have them, ask someone to add them for you. What else can I do to help prevent falls?  Wear shoes  that:  Do not have high heels.  Have rubber bottoms.  Are comfortable and fit you well.  Are closed at the toe. Do not wear sandals.  If you use a stepladder:  Make sure that it is fully opened. Do not climb a closed stepladder.  Make sure that both sides of the stepladder are locked into place.  Ask someone to hold it for you, if possible.  Clearly mark and make sure that you can see:  Any grab bars or handrails.  First and last steps.  Where the edge of each step is.  Use tools that help you move around (mobility aids) if they are needed. These include:  Canes.  Walkers.  Scooters.  Crutches.  Turn on the lights when you go into a dark area. Replace any light bulbs as soon as they burn out.  Set up your furniture so you have a clear path. Avoid moving your furniture around.  If any of your floors are uneven, fix them.  If there are any pets around you, be aware of where they are.  Review your medicines with your doctor. Some medicines can make you feel dizzy. This can increase your chance of falling. Ask your doctor what other things that you can do to help prevent falls. This information is not intended to replace advice given to you by your health care provider. Make sure you discuss any questions you have with your health care provider. Document Released: 08/17/2009 Document Revised: 03/28/2016 Document Reviewed: 11/25/2014 Elsevier Interactive Patient Education  2017 [employer].

## 2020-11-10 ENCOUNTER — Encounter: Payer: Self-pay | Admitting: Family Medicine

## 2020-11-10 ENCOUNTER — Other Ambulatory Visit: Payer: Self-pay

## 2020-11-10 ENCOUNTER — Ambulatory Visit (INDEPENDENT_AMBULATORY_CARE_PROVIDER_SITE_OTHER): Payer: Medicare HMO | Admitting: Family Medicine

## 2020-11-10 VITALS — BP 105/69 | HR 57 | Temp 97.8°F | Ht 64.0 in | Wt 160.0 lb

## 2020-11-10 DIAGNOSIS — G20 Parkinson's disease: Secondary | ICD-10-CM | POA: Diagnosis not present

## 2020-11-10 DIAGNOSIS — I2510 Atherosclerotic heart disease of native coronary artery without angina pectoris: Secondary | ICD-10-CM

## 2020-11-10 DIAGNOSIS — E782 Mixed hyperlipidemia: Secondary | ICD-10-CM | POA: Diagnosis not present

## 2020-11-10 DIAGNOSIS — D649 Anemia, unspecified: Secondary | ICD-10-CM

## 2020-11-10 DIAGNOSIS — M810 Age-related osteoporosis without current pathological fracture: Secondary | ICD-10-CM

## 2020-11-10 DIAGNOSIS — Z0000 Encounter for general adult medical examination without abnormal findings: Secondary | ICD-10-CM | POA: Diagnosis not present

## 2020-11-10 NOTE — Assessment & Plan Note (Signed)
Previously well controlled Continue statin at current dose Repeat lipid panel and CMP

## 2020-11-10 NOTE — Patient Instructions (Signed)
Preventive Care 38 Years and Older, Female Preventive care refers to lifestyle choices and visits with your health care provider that can promote health and wellness. This includes:  A yearly physical exam. This is also called an annual well check.  Regular dental and eye exams.  Immunizations.  Screening for certain conditions.  Healthy lifestyle choices, such as diet and exercise. What can I expect for my preventive care visit? Physical exam Your health care provider will check:  Height and weight. These may be used to calculate body mass index (BMI), which is a measurement that tells if you are at a healthy weight.  Heart rate and blood pressure.  Your skin for abnormal spots. Counseling Your health care provider may ask you questions about:  Alcohol, tobacco, and drug use.  Emotional well-being.  Home and relationship well-being.  Sexual activity.  Eating habits.  History of falls.  Memory and ability to understand (cognition).  Work and work Statistician.  Pregnancy and menstrual history. What immunizations do I need?  Influenza (flu) vaccine  This is recommended every year. Tetanus, diphtheria, and pertussis (Tdap) vaccine  You may need a Td booster every 10 years. Varicella (chickenpox) vaccine  You may need this vaccine if you have not already been vaccinated. Zoster (shingles) vaccine  You may need this after age 33. Pneumococcal conjugate (PCV13) vaccine  One dose is recommended after age 33. Pneumococcal polysaccharide (PPSV23) vaccine  One dose is recommended after age 72. Measles, mumps, and rubella (MMR) vaccine  You may need at least one dose of MMR if you were born in 1957 or later. You may also need a second dose. Meningococcal conjugate (MenACWY) vaccine  You may need this if you have certain conditions. Hepatitis A vaccine  You may need this if you have certain conditions or if you travel or work in places where you may be exposed  to hepatitis A. Hepatitis B vaccine  You may need this if you have certain conditions or if you travel or work in places where you may be exposed to hepatitis B. Haemophilus influenzae type b (Hib) vaccine  You may need this if you have certain conditions. You may receive vaccines as individual doses or as more than one vaccine together in one shot (combination vaccines). Talk with your health care provider about the risks and benefits of combination vaccines. What tests do I need? Blood tests  Lipid and cholesterol levels. These may be checked every 5 years, or more frequently depending on your overall health.  Hepatitis C test.  Hepatitis B test. Screening  Lung cancer screening. You may have this screening every year starting at age 39 if you have a 30-pack-year history of smoking and currently smoke or have quit within the past 15 years.  Colorectal cancer screening. All adults should have this screening starting at age 36 and continuing until age 15. Your health care provider may recommend screening at age 23 if you are at increased risk. You will have tests every 1-10 years, depending on your results and the type of screening test.  Diabetes screening. This is done by checking your blood sugar (glucose) after you have not eaten for a while (fasting). You may have this done every 1-3 years.  Mammogram. This may be done every 1-2 years. Talk with your health care provider about how often you should have regular mammograms.  BRCA-related cancer screening. This may be done if you have a family history of breast, ovarian, tubal, or peritoneal cancers.  Other tests  Sexually transmitted disease (STD) testing.  Bone density scan. This is done to screen for osteoporosis. You may have this done starting at age 44. Follow these instructions at home: Eating and drinking  Eat a diet that includes fresh fruits and vegetables, whole grains, lean protein, and low-fat dairy products. Limit  your intake of foods with high amounts of sugar, saturated fats, and salt.  Take vitamin and mineral supplements as recommended by your health care provider.  Do not drink alcohol if your health care provider tells you not to drink.  If you drink alcohol: ? Limit how much you have to 0-1 drink a day. ? Be aware of how much alcohol is in your drink. In the [place] one drink equals one 12 oz bottle of beer (355 mL), one 5 oz glass of wine (148 mL), or one 1 oz glass of hard liquor (44 mL). Lifestyle  Take daily care of your teeth and gums.  Stay active. Exercise for at least 30 minutes on 5 or more days each week.  Do not use any products that contain nicotine or tobacco, such as cigarettes, e-cigarettes, and chewing tobacco. If you need help quitting, ask your health care provider.  If you are sexually active, practice safe sex. Use a condom or other form of protection in order to prevent STIs (sexually transmitted infections).  Talk with your health care provider about taking a low-dose aspirin or statin. What's next?  Go to your health care provider once a year for a well check visit.  Ask your health care provider how often you should have your eyes and teeth checked.  Stay up to date on all vaccines. This information is not intended to replace advice given to you by your health care provider. Make sure you discuss any questions you have with your health care provider. Document Revised: 10/15/2018 Document Reviewed: 10/15/2018 Elsevier Patient Education  2020 [employer].

## 2020-11-10 NOTE — Assessment & Plan Note (Signed)
Encourage resuming calcium and vitamin D supplementation

## 2020-11-10 NOTE — Assessment & Plan Note (Signed)
History of MI Continue aspirin and statin Followed by cardiology No chest pain 

## 2020-11-10 NOTE — Progress Notes (Signed)
Complete physical exam   Patient: Felicia Snyder   DOB: [birth year]   75 y.o. Female  MRN: [id number] Visit Date: 11/10/2020  Today's healthcare provider: Lavon Paganini, MD   Chief Complaint  Patient presents with  . Annual Exam   Subjective    Felicia Snyder is a 75 y.o. female who presents today for a complete physical exam.  She reports consuming a general diet. Exercise is limited by orthopedic condition(s): recent right knee replacement.. She generally feels well. She reports sleeping well. She does not have additional problems to discuss today.  HPI    Past Medical History:  Diagnosis Date  . Anal fissure   . Cancer [facility]) 1996   colon cancer  . Cancer ([place]) 2015   melanoma/ right shoulder  . Cataract   . GERD (gastroesophageal reflux disease)   . Hemorrhoids   . Hyperlipidemia   . Myocardial infarction ([place])   . Parkinson disease ([place])   . Premature osteoporosis   . Seasonal allergies    Past Surgical History:  Procedure Laterality Date  . BACK SURGERY     lower back x 2  . BLADDER SURGERY    . Broken Ankle  2011  . COLECTOMY    . COLONOSCOPY    . CORONARY ANGIOPLASTY     stents   . EYE SURGERY Bilateral   . Ruptured Disk  [id number]  . TOOTH EXTRACTION    . TOTAL ABDOMINAL HYSTERECTOMY  2016  . TOTAL KNEE ARTHROPLASTY Right 06/06/2020   Procedure: TOTAL KNEE ARTHROPLASTY;  Surgeon: Hessie Knows, MD;  Location: [facility] ORS;  Service: Orthopedics;  Laterality: Right;   Social History   Socioeconomic History  . Marital status: Married    Spouse name: Not on file  . Number of children: 2  . Years of education: masters  . Highest education level: Master's degree (e.g., MA, MS, MEng, MEd, MSW, MBA)  Occupational History  . Occupation: retired  Tobacco Use  . Smoking status: Former Smoker    Types: Cigarettes  . Smokeless tobacco: Never Used  . Tobacco comment: quit at age [age]  Vaping Use  . Vaping Use: Never used  Substance and  Sexual Activity  . Alcohol use: Yes    Alcohol/week: 7.0 standard drinks    Types: 7 Glasses of wine per week    Comment: 0-2/day  . Drug use: No  . Sexual activity: Yes    Partners: Male    Birth control/protection: Post-menopausal  Other Topics Concern  . Not on file  Social History Narrative  . Not on file   Social Determinants of Health   Financial Resource Strain: Low Risk   . Difficulty of Paying Living Expenses: Not hard at all  Food Insecurity: No Food Insecurity  . Worried About Charity fundraiser in the Last Year: Never true  . Ran Out of Food in the Last Year: Never true  Transportation Needs: No Transportation Needs  . Lack of Transportation (Medical): No  . Lack of Transportation (Non-Medical): No  Physical Activity: Inactive  . Days of Exercise per Week: 0 days  . Minutes of Exercise per Session: 0 min  Stress: No Stress Concern Present  . Feeling of Stress : Only a little  Social Connections: Moderately Integrated  . Frequency of Communication with Friends and Family: More than three times a week  . Frequency of Social Gatherings with Friends and Family: More than three times a week  . Attends  Religious Services: More than 4 times per year  . Active Member of Clubs or Organizations: No  . Attends Archivist Meetings: Never  . Marital Status: Married  Human resources officer Violence: Not At Risk  . Fear of Current or Ex-Partner: No  . Emotionally Abused: No  . Physically Abused: No  . Sexually Abused: No   Family Status  Relation Name Status  . Mother  Deceased at age 55  . Father  Deceased at age 64  . Brother  Alive  . Daughter  Alive  . Son  Alive  . MGF  Deceased   Family History  Problem Relation Age of Onset  . Stroke Mother   . Breast cancer Mother        55's  . Parkinsonism Mother   . Depression Mother   . Heart attack Father   . Heart disease Brother   . Colon cancer Maternal Grandfather    No Known Allergies  Patient Care  Team: Virginia Crews, MD as PCP - General (Family Medicine) Lezlie Octave, MD as Referring Physician (Psychiatry) Isaias Cowman, MD as Consulting Physician (Cardiology) Levy Sjogren, MD as Referring Physician (Dermatology) System, Provider Not In (Ophthalmology) Hessie Knows, MD as Consulting Physician (Orthopedic Surgery) Lucilla Lame, MD as Consulting Physician (Gastroenterology)   Medications: Outpatient Medications Prior to Visit  Medication Sig  . acyclovir (ZOVIRAX) 200 MG capsule Take 1 capsule (200 mg total) by mouth 5 (five) times daily. For five days per flare. (Patient taking differently: Take 200 mg by mouth 5 (five) times daily as needed (flares.). For 5 days.)  . aspirin EC 81 MG tablet Take 81 mg by mouth daily.   Marland Kitchen atorvastatin (LIPITOR) 80 MG tablet Take 80 mg by mouth at bedtime.   . bisacodyl (DULCOLAX) 5 MG EC tablet Take 5 mg by mouth daily as needed for moderate constipation.  . carbidopa-levodopa (SINEMET IR) 25-100 MG tablet Take 1 tablet by mouth 2 (two) times daily.   Marland Kitchen loratadine (CLARITIN) 10 MG tablet Take 10 mg by mouth daily as needed for allergies.   . nitroGLYCERIN (NITROSTAT) 0.4 MG SL tablet Place 0.4 mg under the tongue every 5 (five) minutes x 3 doses as needed for chest pain.   Vladimir Faster Glycol-Propyl Glycol (LUBRICANT EYE DROPS) 0.4-0.3 % SOLN Place 1 drop into both eyes 3 (three) times daily as needed (irritation/dry eyes).  . pramipexole (MIRAPEX) 0.5 MG tablet Take 0.5 mg by mouth in the morning and at bedtime.  . pramipexole (MIRAPEX) 1 MG tablet Take 1 mg by mouth 2 (two) times daily.   . [DISCONTINUED] Ascorbic Acid (VITAMIN C) 1000 MG tablet Take 1,000 mg by mouth daily at 12 noon. (Patient not taking: No sig reported)  . [DISCONTINUED] Calcium-Magnesium-Vitamin D (CALCIUM 1200+D3 PO) Take 1 tablet by mouth daily at 12 noon. (Patient not taking: No sig reported)  . [DISCONTINUED] Coenzyme Q10 (COQ10) 200 MG CAPS Take  200 mg by mouth daily at 12 noon. (Patient not taking: No sig reported)  . [DISCONTINUED] cyanocobalamin 1000 MCG tablet Take 1,000 mcg by mouth daily at 12 noon.  (Patient not taking: No sig reported)  . [DISCONTINUED] enoxaparin (LOVENOX) 40 MG/0.4ML injection Inject 0.4 mLs (40 mg total) into the skin daily for 14 doses.  . [DISCONTINUED] HYDROcodone-acetaminophen (NORCO/VICODIN) 5-325 MG tablet Take 1-2 tablets by mouth every 4 (four) hours as needed for moderate pain (pain score 4-6).  . [DISCONTINUED] meloxicam (MOBIC) 7.5 MG tablet TAKE 1 TABLET  BY MOUTH EVERY DAY  . [DISCONTINUED] methocarbamol (ROBAXIN) 500 MG tablet Take 1 tablet (500 mg total) by mouth every 6 (six) hours as needed for muscle spasms.  . [DISCONTINUED] Omega-3 Fatty Acids (FISH OIL) 1000 MG CAPS Take 1,000 mg by mouth daily at 12 noon. (Patient not taking: No sig reported)  . [DISCONTINUED] traMADol (ULTRAM) 50 MG tablet Take 1 tablet (50 mg total) by mouth every 6 (six) hours.  . [DISCONTINUED] vitamin E 400 UNIT capsule Take 400 Units by mouth daily at 12 noon.  (Patient not taking: No sig reported)  . [DISCONTINUED] Zinc 50 MG TABS Take 50 mg by mouth daily at 12 noon. (Patient not taking: No sig reported)   No facility-administered medications prior to visit.    Review of Systems  Constitutional: Negative.   HENT: Negative.   Eyes: Negative.   Respiratory: Negative.   Cardiovascular: Negative.   Gastrointestinal: Negative.   Endocrine: Negative.   Genitourinary: Negative.   Musculoskeletal: Positive for arthralgias and joint swelling. Negative for back pain, gait problem, myalgias, neck pain and neck stiffness.  Skin: Negative.   Allergic/Immunologic: Negative.   Neurological: Negative.   Hematological: Negative.   Psychiatric/Behavioral: Negative.       Objective    BP 105/69 (BP Location: Left Arm, Patient Position: Sitting, Cuff Size: Large)   Pulse (!) 57   Temp 97.8 F (36.6 C) (Oral)   Ht 5'  4" (1.626 m)   Wt 160 lb (72.6 kg)   BMI 27.46 kg/m    Physical Exam Constitutional:      Appearance: Normal appearance. She is normal weight.  HENT:     Head: Normocephalic and atraumatic.     Right Ear: Tympanic membrane, ear canal and external ear normal.     Left Ear: Tympanic membrane, ear canal and external ear normal.     Nose: Nose normal.     Mouth/Throat:     Mouth: Mucous membranes are moist.     Pharynx: Oropharynx is clear.  Eyes:     Extraocular Movements: Extraocular movements intact.     Conjunctiva/sclera: Conjunctivae normal.     Pupils: Pupils are equal, round, and reactive to light.  Cardiovascular:     Rate and Rhythm: Normal rate and regular rhythm.     Pulses: Normal pulses.     Heart sounds: Normal heart sounds.  Pulmonary:     Effort: Pulmonary effort is normal.     Breath sounds: Normal breath sounds.  Abdominal:     General: Abdomen is flat. Bowel sounds are normal.     Palpations: Abdomen is soft.  Musculoskeletal:        General: Normal range of motion.     Cervical back: Normal range of motion and neck supple.  Skin:    General: Skin is warm and dry.  Neurological:     General: No focal deficit present.     Mental Status: She is alert and oriented to person, place, and time. Mental status is at baseline.  Psychiatric:        Mood and Affect: Mood normal.        Behavior: Behavior normal.        Thought Content: Thought content normal.        Judgment: Judgment normal.       Last depression screening scores PHQ 2/9 Scores 11/09/2020 10/06/2019 01/13/2019  PHQ - 2 Score 0 0 0  PHQ- 9 Score - - 1   Last fall  risk screening Fall Risk  11/09/2020  Falls in the past year? 0  Number falls in past yr: 0  Injury with Fall? 0   Last Audit-C alcohol use screening Alcohol Use Disorder Test (AUDIT) 11/09/2020  1. How often do you have a drink containing alcohol? 4  2. How many drinks containing alcohol do you have on a typical day when you are  drinking? 0  3. How often do you have six or more drinks on one occasion? 0  AUDIT-C Score 4  4. How often during the last year have you found that you were not able to stop drinking once you had started? 0  5. How often during the last year have you failed to do what was normally expected from you because of drinking? 0  6. How often during the last year have you needed a first drink in the morning to get yourself going after a heavy drinking session? 0  7. How often during the last year have you had a feeling of guilt of remorse after drinking? 0  8. How often during the last year have you been unable to remember what happened the night before because you had been drinking? 0  9. Have you or someone else been injured as a result of your drinking? 0  10. Has a relative or friend or a doctor or another health worker been concerned about your drinking or suggested you cut down? 0  Alcohol Use Disorder Identification Test Final Score (AUDIT) 4  Alcohol Brief Interventions/Follow-up AUDIT Score <7 follow-up not indicated   A score of 3 or more in women, and 4 or more in men indicates increased risk for alcohol abuse, EXCEPT if all of the points are from question 1   No results found for any visits on 11/10/20.  Assessment & Plan    Routine Health Maintenance and Physical Exam  Exercise Activities and Dietary recommendations Goals    . DIET - INCREASE WATER INTAKE     Recommend to drink at least 6-8 8oz glasses of water per day.       Immunization History  Administered Date(s) Administered  . Influenza, High Dose Seasonal PF 10/07/2018, 06/25/2019  . Influenza-Unspecified 08/16/2020  . PFIZER SARS-COV-2 Vaccination 12/11/2019, 01/01/2020, 08/24/2020  . Pneumococcal Conjugate-13 05/17/2014  . Pneumococcal Polysaccharide-23 11/10/2012  . Tdap 07/03/2011    Health Maintenance  Topic Date Due  . COLONOSCOPY (Pts 45-42yrs Insurance coverage will need to be confirmed)  11/19/2019  .  COVID-19 Vaccine (4 - Booster for Pfizer series) 02/22/2021  . TETANUS/TDAP  07/02/2021  . MAMMOGRAM  04/07/2022  . DEXA SCAN  07/02/2022  . INFLUENZA VACCINE  Completed  . Hepatitis C Screening  Completed  . PNA vac Low Risk Adult  Completed    Discussed health benefits of physical activity, and encouraged her to engage in regular exercise appropriate for her age and condition.  Problem List Items Addressed This Visit      Cardiovascular and Mediastinum   CAD (coronary artery disease)    History of MI Continue aspirin and statin Followed by cardiology No chest pain        Nervous and Auditory   Parkinson's disease ([place])    Chronic and stable Followed by neurology Continue current medications        Musculoskeletal and Integument   OP (osteoporosis)    Encourage resuming calcium and vitamin D supplementation        Other   Hyperlipidemia, mixed  Previously well controlled Continue statin at current dose Repeat lipid panel and CMP      Relevant Orders   Lipid panel   Comprehensive metabolic panel    Other Visit Diagnoses    Encounter for annual physical exam    -  Primary   Relevant Orders   Lipid panel   CBC w/Diff/Platelet   Comprehensive metabolic panel   Anemia, unspecified type       Relevant Orders   CBC w/Diff/Platelet       Return in about 1 year (around 11/10/2021) for CPE, AWV.     I, Lavon Paganini, MD, have reviewed all documentation for this visit. The documentation on 11/10/20 for the exam, diagnosis, procedures, and orders are all accurate and complete.   Jadore Veals, Dionne Bucy, MD, MPH [facility]

## 2020-11-10 NOTE — Assessment & Plan Note (Signed)
Chronic and stable Followed by neurology Continue current medications 

## 2020-11-14 ENCOUNTER — Ambulatory Visit: Payer: Medicare HMO | Admitting: Gastroenterology

## 2020-11-17 LAB — LIPID PANEL
Chol/HDL Ratio: 1.8 ratio (ref 0.0–4.4)
Cholesterol, Total: 138 mg/dL (ref 100–199)
HDL: 78 mg/dL (ref >39)
LDL Chol Calc (NIH): 51 mg/dL (ref 0–99)
Triglycerides: 37 mg/dL (ref 0–149)
VLDL Cholesterol Cal: 9 mg/dL (ref 5–40)

## 2020-11-17 LAB — CBC WITH DIFFERENTIAL/PLATELET
Basophils Absolute: 0.1 10*3/uL (ref 0.0–0.2)
Basos: 1 %
EOS (ABSOLUTE): 0.1 10*3/uL (ref 0.0–0.4)
Eos: 1 %
Hematocrit: 38.8 % (ref 34.0–46.6)
Hemoglobin: 12.8 g/dL (ref 11.1–15.9)
Immature Grans (Abs): 0 10*3/uL (ref 0.0–0.1)
Immature Granulocytes: 0 %
Lymphocytes Absolute: 1.4 10*3/uL (ref 0.7–3.1)
Lymphs: 24 %
MCH: 28.8 pg (ref 26.6–33.0)
MCHC: 33 g/dL (ref 31.5–35.7)
MCV: 87 fL (ref 79–97)
Monocytes Absolute: 0.4 10*3/uL (ref 0.1–0.9)
Monocytes: 6 %
Neutrophils Absolute: 3.9 10*3/uL (ref 1.4–7.0)
Neutrophils: 68 %
Platelets: 264 10*3/uL (ref 150–450)
RBC: 4.44 x10E6/uL (ref 3.77–5.28)
RDW: 12.5 % (ref 11.7–15.4)
WBC: 5.8 10*3/uL (ref 3.4–10.8)

## 2020-11-17 LAB — COMPREHENSIVE METABOLIC PANEL
ALT: 9 IU/L (ref 0–32)
AST: 24 IU/L (ref 0–40)
Albumin/Globulin Ratio: 2.5 — ABNORMAL HIGH (ref 1.2–2.2)
Albumin: 4.5 g/dL (ref 3.7–4.7)
Alkaline Phosphatase: 116 IU/L (ref 44–121)
BUN/Creatinine Ratio: 27 (ref 12–28)
BUN: 15 mg/dL (ref 8–27)
Bilirubin Total: 0.4 mg/dL (ref 0.0–1.2)
CO2: 24 mmol/L (ref 20–29)
Calcium: 10.1 mg/dL (ref 8.7–10.3)
Chloride: 103 mmol/L (ref 96–106)
Creatinine, Ser: 0.56 mg/dL — ABNORMAL LOW (ref 0.57–1.00)
GFR calc Af Amer: 107 mL/min/{1.73_m2} (ref >59)
GFR calc non Af Amer: 93 mL/min/{1.73_m2} (ref >59)
Globulin, Total: 1.8 g/dL (ref 1.5–4.5)
Glucose: 92 mg/dL (ref 65–99)
Potassium: 4.4 mmol/L (ref 3.5–5.2)
Sodium: 141 mmol/L (ref 134–144)
Total Protein: 6.3 g/dL (ref 6.0–8.5)

## 2020-11-21 ENCOUNTER — Telehealth: Payer: Self-pay

## 2020-11-21 NOTE — Telephone Encounter (Signed)
-----   Message from Virginia Crews, MD sent at 11/21/2020 11:25 AM EST ----- Normal labs

## 2020-11-21 NOTE — Telephone Encounter (Signed)
LMTCB 11/21/2020.  PEC please advise pt of lab results when she calls back.   Thanks,   -Josph Macho

## 2020-11-22 ENCOUNTER — Telehealth: Payer: Self-pay | Admitting: Family Medicine

## 2020-11-22 NOTE — Telephone Encounter (Signed)
Copied from [place] [id number]. Topic: Quick Communication - Lab Results (Clinic Use ONLY) >> Nov 21, 2020  5:04 PM Kizzie Furnish, CMA wrote: Called patient to inform them of  lab results. When patient returns call, triage nurse may disclose results.

## 2020-11-23 NOTE — Telephone Encounter (Signed)
See result note.  

## 2020-12-21 ENCOUNTER — Other Ambulatory Visit: Payer: Self-pay

## 2020-12-21 ENCOUNTER — Ambulatory Visit: Payer: Medicare HMO | Admitting: Gastroenterology

## 2020-12-21 ENCOUNTER — Encounter: Payer: Self-pay | Admitting: Gastroenterology

## 2020-12-21 VITALS — BP 130/77 | HR 97 | Ht 64.0 in | Wt 163.8 lb

## 2020-12-21 DIAGNOSIS — K219 Gastro-esophageal reflux disease without esophagitis: Secondary | ICD-10-CM

## 2020-12-21 DIAGNOSIS — Z85038 Personal history of other malignant neoplasm of large intestine: Secondary | ICD-10-CM

## 2020-12-21 MED ORDER — SUPREP BOWEL PREP KIT 17.5-3.13-1.6 GM/177ML PO SOLN: 1.0000 | ORAL | 0 refills | Status: AC

## 2020-12-21 MED ORDER — OMEPRAZOLE 20 MG PO CPDR
20.0000 mg | DELAYED_RELEASE_CAPSULE | Freq: Every day | ORAL | 3 refills | Status: DC
Start: 2020-12-21 — End: 2021-02-06

## 2020-12-21 NOTE — Progress Notes (Signed)
Gastroenterology Consultation  Referring Provider:     Virginia Crews, MD Primary Care Physician:  Virginia Crews, MD Primary Gastroenterologist:  Dr. Allen Norris     Reason for Consultation:     Reflux and the need to have a screening colonoscopy        HPI:   Felicia Snyder is a 75 y.o. y/o female referred for consultation & management of reflux and the need to have a screening colonoscopy by Dr. Brita Romp, Dionne Bucy, MD.  This patient comes in today after being seen in the past by a gastroenterologist in [facility] up until 2019.  The patient was sent to me due to a need for screening colonoscopy.  The patient also has been reported to have reflux symptoms.  The patient had contacted her primary care provider back in November requesting a referral to GI for her reflux symptoms and had stated that time that she was due for colonoscopy. The patient had colon cancer with a resection and chemo in 1995. The patient reports that her reflux symptoms are usually manifested by bending over and drinking wine.  She also states that it presents as a cough.  When she takes 20 mg of omeprazole daily for symptoms go away but she does not take it on a regular basis.  There is no report of any unexplained weight loss fevers chills nausea vomiting black stools or bloody stools.  The patient's last colonoscopy she states was 5 years ago.  Past Medical History:  Diagnosis Date  . Anal fissure   . Cancer [facility]) 1996   colon cancer  . Cancer ([place]) 2015   melanoma/ right shoulder  . Cataract   . GERD (gastroesophageal reflux disease)   . Hemorrhoids   . Hyperlipidemia   . Myocardial infarction ([place])   . Parkinson disease ([place])   . Premature osteoporosis   . Seasonal allergies     Past Surgical History:  Procedure Laterality Date  . BACK SURGERY     lower back x 2  . BLADDER SURGERY    . Broken Ankle  2011  . COLECTOMY    . COLONOSCOPY    . CORONARY ANGIOPLASTY      stents   . EYE SURGERY Bilateral   . Ruptured Disk  7510,2585  . TOOTH EXTRACTION    . TOTAL ABDOMINAL HYSTERECTOMY  2016  . TOTAL KNEE ARTHROPLASTY Right 06/06/2020   Procedure: TOTAL KNEE ARTHROPLASTY;  Surgeon: Hessie Knows, MD;  Location: [facility] ORS;  Service: Orthopedics;  Laterality: Right;    Prior to Admission medications   Medication Sig Start Date End Date Taking? Authorizing Provider  acyclovir (ZOVIRAX) 200 MG capsule Take 1 capsule (200 mg total) by mouth 5 (five) times daily. For five days per flare. Patient taking differently: Take 200 mg by mouth 5 (five) times daily as needed (flares.). For 5 days. 10/24/16   Carmon Ginsberg, PA  aspirin EC 81 MG tablet Take 81 mg by mouth daily.     [provider]  atorvastatin (LIPITOR) 80 MG tablet Take 80 mg by mouth at bedtime.     [provider]  bisacodyl (DULCOLAX) 5 MG EC tablet Take 5 mg by mouth daily as needed for moderate constipation.    [provider]  carbidopa-levodopa (SINEMET IR) 25-100 MG tablet Take 1 tablet by mouth 2 (two) times daily.     [provider]  loratadine (CLARITIN) 10 MG tablet Take 10 mg by mouth  daily as needed for allergies.     [provider]  nitroGLYCERIN (NITROSTAT) 0.4 MG SL tablet Place 0.4 mg under the tongue every 5 (five) minutes x 3 doses as needed for chest pain.  08/22/15   [provider]  Polyethyl Glycol-Propyl Glycol (LUBRICANT EYE DROPS) 0.4-0.3 % SOLN Place 1 drop into both eyes 3 (three) times daily as needed (irritation/dry eyes).    [provider]  pramipexole (MIRAPEX) 0.5 MG tablet Take 0.5 mg by mouth in the morning and at bedtime. 03/31/20   [provider]  pramipexole (MIRAPEX) 1 MG tablet Take 1 mg by mouth 2 (two) times daily.     [provider]    Family History  Problem Relation Age of Onset  . Stroke Mother   . Breast cancer Mother        13's  . Parkinsonism Mother   . Depression  Mother   . Heart attack Father   . Heart disease Brother   . Colon cancer Maternal Grandfather      Social History   Tobacco Use  . Smoking status: Former Smoker    Types: Cigarettes  . Smokeless tobacco: Never Used  . Tobacco comment: quit at age [age]  Vaping Use  . Vaping Use: Never used  Substance Use Topics  . Alcohol use: Yes    Alcohol/week: 7.0 standard drinks    Types: 7 Glasses of wine per week    Comment: 0-2/day  . Drug use: No    Allergies as of 12/21/2020  . (No Known Allergies)    Review of Systems:    All systems reviewed and negative except where noted in HPI.   Physical Exam:  There were no vitals taken for this visit. No LMP recorded. Patient has had a hysterectomy. General:   Alert,  Well-developed, well-nourished, pleasant and cooperative in NAD Head:  Normocephalic and atraumatic. Eyes:  Sclera clear, no icterus.   Conjunctiva pink. Ears:  Normal auditory acuity. Neck:  Supple; no masses or thyromegaly. Lungs:  Respirations even and unlabored.  Clear throughout to auscultation.   No wheezes, crackles, or rhonchi. No acute distress. Heart:  Regular rate and rhythm; no murmurs, clicks, rubs, or gallops. Abdomen:  Normal bowel sounds.  No bruits.  Soft, non-tender and non-distended without masses, hepatosplenomegaly or hernias noted.  No guarding or rebound tenderness.  Negative Carnett sign.   Rectal:  Deferred.  Pulses:  Normal pulses noted. Extremities:  No clubbing or edema.  No cyanosis. Neurologic:  Alert and oriented x3;  grossly normal neurologically. Skin:  Intact without significant lesions or rashes.  No jaundice. Lymph Nodes:  No significant cervical adenopathy. Psych:  Alert and cooperative. Normal mood and affect.  Imaging Studies: No results found.  Assessment and Plan:   Felicia Snyder is a 75 y.o. y/o female who comes in today with a history of colon cancer in 1995 and had a resection with chemotherapy.  The patient  reports that her last colonoscopy was 5 years ago.  The patient will be set up for repeat colonoscopy.  The patient has no worry symptoms for any upper GI issues and is well controlled when she takes the medication.  The patient will be given 20 mg of omeprazole with refills as a prescription so she does not have to take the over-the-counter medication.  The patient has been explained the plan agrees with it.    Lucilla Lame, MD. [facility] Regal    Note: This dictation  was prepared with Dragon dictation along with smaller phrase technology. Any transcriptional errors that result from this process are unintentional.

## 2021-01-05 ENCOUNTER — Other Ambulatory Visit: Payer: Self-pay

## 2021-01-05 ENCOUNTER — Other Ambulatory Visit
Admission: RE | Admit: 2021-01-05 | Discharge: 2021-01-05 | Disposition: A | Discharge status: Home / Self Care | Payer: Medicare HMO | Source: Ambulatory Visit | Attending: Gastroenterology | Admitting: Gastroenterology

## 2021-01-05 DIAGNOSIS — Z20822 Contact with and (suspected) exposure to covid-19: Secondary | ICD-10-CM | POA: Diagnosis not present

## 2021-01-05 DIAGNOSIS — Z01812 Encounter for preprocedural laboratory examination: Secondary | ICD-10-CM | POA: Diagnosis present

## 2021-01-05 LAB — SARS CORONAVIRUS 2 (TAT 6-24 HRS): SARS Coronavirus 2: NEGATIVE

## 2021-01-09 ENCOUNTER — Encounter
Admission: RE | Disposition: A | Discharge status: Home / Self Care | Payer: Self-pay | Source: Home / Self Care | Attending: Gastroenterology

## 2021-01-09 ENCOUNTER — Other Ambulatory Visit: Payer: Self-pay

## 2021-01-09 ENCOUNTER — Ambulatory Visit: Payer: Medicare HMO | Admitting: Anesthesiology

## 2021-01-09 ENCOUNTER — Encounter: Payer: Self-pay | Admitting: Gastroenterology

## 2021-01-09 ENCOUNTER — Ambulatory Visit
Admission: RE | Admit: 2021-01-09 | Discharge: 2021-01-09 | Disposition: A | Discharge status: Home / Self Care | Payer: Medicare HMO | Attending: Gastroenterology | Admitting: Gastroenterology

## 2021-01-09 DIAGNOSIS — Z7982 Long term (current) use of aspirin: Secondary | ICD-10-CM | POA: Insufficient documentation

## 2021-01-09 DIAGNOSIS — Z87891 Personal history of nicotine dependence: Secondary | ICD-10-CM | POA: Insufficient documentation

## 2021-01-09 DIAGNOSIS — Z79899 Other long term (current) drug therapy: Secondary | ICD-10-CM | POA: Insufficient documentation

## 2021-01-09 DIAGNOSIS — Z1211 Encounter for screening for malignant neoplasm of colon: Secondary | ICD-10-CM | POA: Insufficient documentation

## 2021-01-09 DIAGNOSIS — Z85038 Personal history of other malignant neoplasm of large intestine: Secondary | ICD-10-CM

## 2021-01-09 DIAGNOSIS — K641 Second degree hemorrhoids: Secondary | ICD-10-CM | POA: Diagnosis not present

## 2021-01-09 DIAGNOSIS — Z9049 Acquired absence of other specified parts of digestive tract: Secondary | ICD-10-CM | POA: Insufficient documentation

## 2021-01-09 HISTORY — PX: COLONOSCOPY WITH PROPOFOL: SHX5780

## 2021-01-09 SURGERY — COLONOSCOPY WITH PROPOFOL
Anesthesia: General

## 2021-01-09 MED ORDER — PROPOFOL 500 MG/50ML IV EMUL
INTRAVENOUS | Status: DC | PRN
  Administered 2021-01-09: 120 ug/kg/min via INTRAVENOUS

## 2021-01-09 MED ORDER — LIDOCAINE 2% (20 MG/ML) 5 ML SYRINGE
INTRAMUSCULAR | Status: DC | PRN
  Administered 2021-01-09: 25 mg via INTRAVENOUS

## 2021-01-09 MED ORDER — PROPOFOL 10 MG/ML IV BOLUS
INTRAVENOUS | Status: DC | PRN
  Administered 2021-01-09: 30 mg via INTRAVENOUS
  Administered 2021-01-09: 20 mg via INTRAVENOUS
  Administered 2021-01-09: 50 mg via INTRAVENOUS

## 2021-01-09 MED ORDER — SODIUM CHLORIDE 0.9 % IV SOLN: INTRAVENOUS | Status: DC

## 2021-01-09 NOTE — H&P (Signed)
Lucilla Lame, MD [address] [place], [place] [address] [phone] Fax : [phone]  Primary Care Physician:  Virginia Crews, MD Primary Gastroenterologist:  Dr. Allen Norris  Pre-Procedure History & Physical: HPI:  Marshall Roehrich is a 75 y.o. female is here for an colonoscopy.   Past Medical History:  Diagnosis Date  . Anal fissure   . Cancer [facility]) 1996   colon cancer  . Cancer ([place]) 2015   melanoma/ right shoulder  . Cataract   . GERD (gastroesophageal reflux disease)   . Hemorrhoids   . Hyperlipidemia   . Myocardial infarction ([place])   . Parkinson disease ([place])   . Premature osteoporosis   . Seasonal allergies     Past Surgical History:  Procedure Laterality Date  . BACK SURGERY     lower back x 2  . BLADDER SURGERY    . Broken Ankle  2011  . COLECTOMY    . COLONOSCOPY    . CORONARY ANGIOPLASTY     stents   . EYE SURGERY Bilateral   . Ruptured Disk  [id number]  . TOOTH EXTRACTION    . TOTAL ABDOMINAL HYSTERECTOMY  2016  . TOTAL KNEE ARTHROPLASTY Right 06/06/2020   Procedure: TOTAL KNEE ARTHROPLASTY;  Surgeon: Hessie Knows, MD;  Location: [facility] ORS;  Service: Orthopedics;  Laterality: Right;    Prior to Admission medications   Medication Sig Start Date End Date Taking? Authorizing Provider  aspirin EC 81 MG tablet Take 81 mg by mouth daily.    Yes [provider]  atorvastatin (LIPITOR) 80 MG tablet Take 80 mg by mouth at bedtime.    Yes [provider]  carbidopa-levodopa (SINEMET IR) 25-100 MG tablet Take 1 tablet by mouth 2 (two) times daily.    Yes [provider]  Na Sulfate-K Sulfate-Mg Sulf (SUPREP BOWEL PREP KIT) 17.5-3.13-1.6 GM/177ML SOLN Take 1 kit by mouth as directed. 12/21/20  Yes Lucilla Lame, MD  omeprazole (PRILOSEC) 20 MG capsule Take 1 capsule (20 mg total) by mouth daily. 12/21/20  Yes Lucilla Lame, MD  pramipexole (MIRAPEX) 0.5 MG tablet Take 0.5 mg by mouth in the morning and at bedtime.  03/31/20  Yes [provider]  pramipexole (MIRAPEX) 1 MG tablet Take 1 mg by mouth 2 (two) times daily.    Yes [provider]  acyclovir (ZOVIRAX) 200 MG capsule Take 1 capsule (200 mg total) by mouth 5 (five) times daily. For five days per flare. Patient taking differently: Take 200 mg by mouth 5 (five) times daily as needed (flares.). For 5 days. 10/24/16   Carmon Ginsberg, PA  bisacodyl (DULCOLAX) 5 MG EC tablet Take 5 mg by mouth daily as needed for moderate constipation.    [provider]  loratadine (CLARITIN) 10 MG tablet Take 10 mg by mouth daily as needed for allergies.     [provider]  nitroGLYCERIN (NITROSTAT) 0.4 MG SL tablet Place 0.4 mg under the tongue every 5 (five) minutes x 3 doses as needed for chest pain.  08/22/15   [provider]  Polyethyl Glycol-Propyl Glycol (LUBRICANT EYE DROPS) 0.4-0.3 % SOLN Place 1 drop into both eyes 3 (three) times daily as needed (irritation/dry eyes).    [provider]    Allergies as of 12/21/2020  . (No Known Allergies)    Family History  Problem Relation Age of Onset  . Stroke Mother   . Breast cancer Mother        40's  .  Parkinsonism Mother   . Depression Mother   . Heart attack Father   . Heart disease Brother   . Colon cancer Maternal Grandfather     Social History   Socioeconomic History  . Marital status: Married    Spouse name: Not on file  . Number of children: 2  . Years of education: masters  . Highest education level: Master's degree (e.g., MA, MS, MEng, MEd, MSW, MBA)  Occupational History  . Occupation: retired  Tobacco Use  . Smoking status: Former Smoker    Types: Cigarettes  . Smokeless tobacco: Never Used  . Tobacco comment: quit at age [age]  Vaping Use  . Vaping Use: Never used  Substance and Sexual Activity  . Alcohol use: Yes    Alcohol/week: 7.0 standard drinks    Types: 7 Glasses of wine per week    Comment: none last 24hrs  . Drug  use: No  . Sexual activity: Yes    Partners: Male    Birth control/protection: Post-menopausal  Other Topics Concern  . Not on file  Social History Narrative  . Not on file   Social Determinants of Health   Financial Resource Strain: Low Risk   . Difficulty of Paying Living Expenses: Not hard at all  Food Insecurity: No Food Insecurity  . Worried About Charity fundraiser in the Last Year: Never true  . Ran Out of Food in the Last Year: Never true  Transportation Needs: No Transportation Needs  . Lack of Transportation (Medical): No  . Lack of Transportation (Non-Medical): No  Physical Activity: Inactive  . Days of Exercise per Week: 0 days  . Minutes of Exercise per Session: 0 min  Stress: No Stress Concern Present  . Feeling of Stress : Only a little  Social Connections: Moderately Integrated  . Frequency of Communication with Friends and Family: More than three times a week  . Frequency of Social Gatherings with Friends and Family: More than three times a week  . Attends Religious Services: More than 4 times per year  . Active Member of Clubs or Organizations: No  . Attends Archivist Meetings: Never  . Marital Status: Married  Human resources officer Violence: Not At Risk  . Fear of Current or Ex-Partner: No  . Emotionally Abused: No  . Physically Abused: No  . Sexually Abused: No    Review of Systems: See HPI, otherwise negative ROS  Physical Exam: There were no vitals taken for this visit. General:   Alert,  pleasant and cooperative in NAD Head:  Normocephalic and atraumatic. Neck:  Supple; no masses or thyromegaly. Lungs:  Clear throughout to auscultation.    Heart:  Regular rate and rhythm. Abdomen:  Soft, nontender and nondistended. Normal bowel sounds, without guarding, and without rebound.   Neurologic:  Alert and  oriented x4;  grossly normal neurologically.  Impression/Plan: Nekeisha Aure is here for an colonoscopy to be performed for history  of colon cancer.  Risks, benefits, limitations, and alternatives regarding  colonoscopy have been reviewed with the patient.  Questions have been answered.  All parties agreeable.   Lucilla Lame, MD  01/09/2021, 8:34 AM

## 2021-01-09 NOTE — Op Note (Signed)
[facility] Gastroenterology Patient Name: Felicia Snyder Procedure Date: 01/09/2021 8:47 AM MRN: [id number] Account #: [id number] Date of Birth: 11/04/1946 Admit Type: Outpatient Age: 75 Room: [facility] ENDO ROOM 4 Gender: Female Note Status: Finalized Procedure:             Colonoscopy Indications:           High risk colon cancer surveillance: Personal history                         of colon cancer Providers:             Lucilla Lame MD, MD Medicines:             Propofol per Anesthesia Complications:         No immediate complications. Procedure:             Pre-Anesthesia Assessment:                        - Prior to the procedure, a History and Physical was                         performed, and patient medications and allergies were                         reviewed. The patient's tolerance of previous                         anesthesia was also reviewed. The risks and benefits                         of the procedure and the sedation options and risks                         were discussed with the patient. All questions were                         answered, and informed consent was obtained. Prior                         Anticoagulants: The patient has taken no previous                         anticoagulant or antiplatelet agents. ASA Grade                         Assessment: II - A patient with mild systemic disease.                         After reviewing the risks and benefits, the patient                         was deemed in satisfactory condition to undergo the                         procedure.                        After obtaining informed consent, the colonoscope was  passed under direct vision. Throughout the procedure,                         the patient's blood pressure, pulse, and oxygen                         saturations were monitored continuously. The                         Colonoscope was introduced through the anus and                          advanced to the the ileocolonic anastomosis. The                         colonoscopy was performed without difficulty. The                         patient tolerated the procedure well. The quality of                         the bowel preparation was excellent. Findings:      The perianal and digital rectal examinations were normal.      Non-bleeding internal hemorrhoids were found during retroflexion. The       hemorrhoids were Grade II (internal hemorrhoids that prolapse but reduce       spontaneously).      There was evidence of a prior end-to-side colo-colonic anastomosis in       the transverse colon. This was patent. Impression:            - Non-bleeding internal hemorrhoids.                        - Patent end-to-side colo-colonic anastomosis.                        - No specimens collected. Recommendation:        - Discharge patient to home.                        - Resume previous diet.                        - Continue present medications. Procedure Code(s):     --- Professional ---                        [phone], Colonoscopy, flexible; diagnostic, including                         collection of specimen(s) by brushing or washing, when                         performed (separate procedure) Diagnosis Code(s):     --- Professional ---                        N98.921, Personal history of other malignant neoplasm                         of large intestine CPT copyright  2019 American Medical Association. All rights reserved. The codes documented in this report are preliminary and upon coder review may  be revised to meet current compliance requirements. Lucilla Lame MD, MD 01/09/2021 9:10:21 AM This report has been signed electronically. Number of Addenda: 0 Note Initiated On: 01/09/2021 8:47 AM Scope Withdrawal Time: 0 hours 6 minutes 54 seconds  Total Procedure Duration: 0 hours 14 minutes 20 seconds  Estimated Blood Loss:  Estimated blood loss: none.       [facility]

## 2021-01-09 NOTE — Anesthesia Preprocedure Evaluation (Signed)
Anesthesia Evaluation  Patient identified by MRN, date of birth, ID band Patient awake    Reviewed: Allergy & Precautions, NPO status , Patient's Chart, lab work & pertinent test results  History of Anesthesia Complications Negative for: history of anesthetic complications  Airway Mallampati: II       Dental   Pulmonary neg sleep apnea, neg COPD, Not current smoker, former smoker,           Cardiovascular (-) hypertension+ Past MI  (-) CHF (-) dysrhythmias (-) Valvular Problems/Murmurs     Neuro/Psych neg Seizures    GI/Hepatic Neg liver ROS, GERD  Medicated and Controlled,  Endo/Other  neg diabetes  Renal/GU negative Renal ROS     Musculoskeletal   Abdominal   Peds  Hematology   Anesthesia Other Findings   Reproductive/Obstetrics                            Anesthesia Physical Anesthesia Plan  ASA: III  Anesthesia Plan: General   Post-op Pain Management:    Induction: Intravenous  PONV Risk Score and Plan: 3 and Propofol infusion and TIVA  Airway Management Planned: Nasal Cannula  Additional Equipment:   Intra-op Plan:   Post-operative Plan:   Informed Consent: I have reviewed the patients History and Physical, chart, labs and discussed the procedure including the risks, benefits and alternatives for the proposed anesthesia with the patient or authorized representative who has indicated his/her understanding and acceptance.       Plan Discussed with:   Anesthesia Plan Comments:         Anesthesia Quick Evaluation

## 2021-01-09 NOTE — Transfer of Care (Signed)
Immediate Anesthesia Transfer of Care Note  Patient: Felicia Snyder  Procedure(s) Performed: COLONOSCOPY WITH PROPOFOL (N/A )  Patient Location: Endoscopy Unit  Anesthesia Type:General  Level of Consciousness: awake and alert   Airway & Oxygen Therapy: Patient Spontanous Breathing  Post-op Assessment: Report given to RN and Post -op Vital signs reviewed and stable  Post vital signs: Reviewed  Last Vitals:  Vitals Value Taken Time  BP    Temp    Pulse 59 01/09/21 0911  Resp 19 01/09/21 0911  SpO2 100 % 01/09/21 0911  Vitals shown include unvalidated device data.  Last Pain:  Vitals:   01/09/21 0829  TempSrc: Temporal         Complications: No complications documented.

## 2021-01-09 NOTE — Anesthesia Postprocedure Evaluation (Signed)
Anesthesia Post Note  Patient: Felicia Snyder  Procedure(s) Performed: COLONOSCOPY WITH PROPOFOL (N/A )  Patient location during evaluation: Endoscopy Anesthesia Type: General Level of consciousness: awake and alert Pain management: pain level controlled Vital Signs Assessment: post-procedure vital signs reviewed and stable Respiratory status: spontaneous breathing and respiratory function stable Cardiovascular status: stable Anesthetic complications: no   No complications documented.   Last Vitals:  Vitals:   01/09/21 0921 01/09/21 0931  BP: (!) 104/51 (!) 107/46  Pulse: 61 61  Resp: 16 18  Temp:    SpO2: 96% 98%    Last Pain:  Vitals:   01/09/21 0931  TempSrc:   PainSc: 0-No pain                 Arlow Spiers K

## 2021-01-10 ENCOUNTER — Encounter: Payer: Self-pay | Admitting: Gastroenterology

## 2021-02-05 ENCOUNTER — Telehealth: Payer: Self-pay

## 2021-02-05 NOTE — Telephone Encounter (Signed)
Advised pt.  Appt scheduled.

## 2021-02-05 NOTE — Telephone Encounter (Signed)
Ok to be seen in person given long duration of cough

## 2021-02-05 NOTE — Telephone Encounter (Signed)
Copied from [place] [phone]. Topic: Appointment Scheduling - Scheduling Inquiry for Clinic >> Feb 05, 2021  9:24 AM Greggory Keen D wrote: Reason for CRM: Pt states she has had a cough for about 3 weeks that sometimes she does have clear or white mucus.  No fever.  She wanted to make an appt but does not pass the DT.  She says she has not done any virtual appts because she does not capability where she lives.  CB#  [phone]

## 2021-02-06 ENCOUNTER — Ambulatory Visit: Payer: Medicare HMO | Admitting: Family Medicine

## 2021-02-06 ENCOUNTER — Other Ambulatory Visit: Payer: Self-pay

## 2021-02-06 ENCOUNTER — Encounter: Payer: Self-pay | Admitting: Family Medicine

## 2021-02-06 VITALS — BP 125/73 | HR 66 | Temp 98.1°F | Wt 163.4 lb

## 2021-02-06 DIAGNOSIS — R1319 Other dysphagia: Secondary | ICD-10-CM

## 2021-02-06 DIAGNOSIS — R053 Chronic cough: Secondary | ICD-10-CM

## 2021-02-06 DIAGNOSIS — K219 Gastro-esophageal reflux disease without esophagitis: Secondary | ICD-10-CM | POA: Diagnosis not present

## 2021-02-06 MED ORDER — OMEPRAZOLE 40 MG PO CPDR: 40.0000 mg | DELAYED_RELEASE_CAPSULE | Freq: Every day | ORAL | 3 refills | Status: DC

## 2021-02-06 NOTE — Progress Notes (Signed)
Established patient visit   Patient: Felicia Snyder   DOB: 15-Sep-1946   75 y.o. Female  MRN: [id number] Visit Date: 02/06/2021  Today's healthcare provider: Lavon Paganini, MD   Chief Complaint  Patient presents with  . Cough   I,Felicia Snyder,acting as a scribe for Lavon Paganini, MD.,have documented all relevant documentation on the behalf of Lavon Paganini, MD,as directed by  Lavon Paganini, MD while in the presence of Lavon Paganini, MD.  Subjective    Cough This is a recurrent problem. The current episode started 1 to 4 weeks ago. The problem has been unchanged. The problem occurs every few hours. The cough is productive of sputum. Associated symptoms include rhinorrhea. Pertinent negatives include no chest pain, chills, ear congestion, ear pain, headaches, heartburn, hemoptysis, myalgias, nasal congestion, postnasal drip, rash, sore throat, shortness of breath, sweats, weight loss or wheezing. The symptoms are aggravated by lying down (eating (chips, peanuts)& bending per pt). She has tried OTC cough suppressant (cough drops per pt) for the symptoms. The treatment provided mild relief. Her past medical history is significant for environmental allergies. There is no history of asthma, bronchiectasis, bronchitis, COPD, emphysema or pneumonia.    3-4 weeks Paroxysms of cough Worse when eating something, layign down at night or bending forward Occasional sneezing No metallic taste or heartburn Occasional dysphagia  Social History   Tobacco Use  . Smoking status: Former Smoker    Types: Cigarettes  . Smokeless tobacco: Never Used  . Tobacco comment: quit at age [age]  Vaping Use  . Vaping Use: Never used  Substance Use Topics  . Alcohol use: Yes    Alcohol/week: 7.0 standard drinks    Types: 7 Glasses of wine per week    Comment: none last 24hrs  . Drug use: No       Medications: Outpatient Medications Prior to Visit  Medication Sig  .  acyclovir (ZOVIRAX) 200 MG capsule Take 1 capsule (200 mg total) by mouth 5 (five) times daily. For five days per flare. (Patient taking differently: Take 200 mg by mouth 5 (five) times daily as needed (flares.). For 5 days.)  . aspirin EC 81 MG tablet Take 81 mg by mouth daily.   Marland Kitchen atorvastatin (LIPITOR) 80 MG tablet Take 80 mg by mouth at bedtime.   . bisacodyl (DULCOLAX) 5 MG EC tablet Take 5 mg by mouth daily as needed for moderate constipation.  . carbidopa-levodopa (SINEMET IR) 25-100 MG tablet Take 1 tablet by mouth 2 (two) times daily.   Marland Kitchen loratadine (CLARITIN) 10 MG tablet Take 10 mg by mouth daily as needed for allergies.   . nitroGLYCERIN (NITROSTAT) 0.4 MG SL tablet Place 0.4 mg under the tongue every 5 (five) minutes x 3 doses as needed for chest pain.   Felicia Snyder Glycol-Propyl Glycol (LUBRICANT EYE DROPS) 0.4-0.3 % SOLN Place 1 drop into both eyes 3 (three) times daily as needed (irritation/dry eyes).  . pramipexole (MIRAPEX) 0.5 MG tablet Take 0.5 mg by mouth in the morning and at bedtime.  . pramipexole (MIRAPEX) 1 MG tablet Take 1 mg by mouth 2 (two) times daily.   . [DISCONTINUED] omeprazole (PRILOSEC) 20 MG capsule Take 1 capsule (20 mg total) by mouth daily.  . Na Sulfate-K Sulfate-Mg Sulf (SUPREP BOWEL PREP KIT) 17.5-3.13-1.6 GM/177ML SOLN Take 1 kit by mouth as directed. (Patient not taking: Reported on 02/06/2021)   No facility-administered medications prior to visit.    Review of Systems  Constitutional: Negative for chills and weight loss.  HENT: Positive for rhinorrhea. Negative for ear pain, postnasal drip and sore throat.   Respiratory: Positive for cough. Negative for hemoptysis, shortness of breath and wheezing.   Cardiovascular: Negative for chest pain.  Gastrointestinal: Negative for heartburn.  Musculoskeletal: Negative for myalgias.  Skin: Negative for rash.  Allergic/Immunologic: Positive for environmental allergies.  Neurological: Negative for headaches.        Objective    BP 125/73 (BP Location: Left Arm, Patient Position: Sitting, Cuff Size: Normal)   Pulse 66   Temp 98.1 F (36.7 C) (Oral)   Wt 163 lb 6.4 oz (74.1 kg)   SpO2 100%   BMI 28.95 kg/m     Physical Exam Vitals reviewed.  Constitutional:      General: She is not in acute distress.    Appearance: Normal appearance. She is well-developed. She is not diaphoretic.  HENT:     Head: Normocephalic and atraumatic.  Eyes:     General: No scleral icterus.    Conjunctiva/sclera: Conjunctivae normal.  Neck:     Thyroid: No thyromegaly.  Cardiovascular:     Rate and Rhythm: Normal rate and regular rhythm.     Pulses: Normal pulses.     Heart sounds: Normal heart sounds. No murmur heard.   Pulmonary:     Effort: Pulmonary effort is normal. No respiratory distress.     Breath sounds: Normal breath sounds. No wheezing, rhonchi or rales.  Abdominal:     General: Bowel sounds are normal. There is no distension.     Palpations: Abdomen is soft.     Tenderness: There is no abdominal tenderness.  Musculoskeletal:     Cervical back: Neck supple.     Right lower leg: No edema.     Left lower leg: No edema.  Lymphadenopathy:     Cervical: No cervical adenopathy.  Skin:    General: Skin is warm and dry.     Findings: No rash.  Neurological:     Mental Status: She is alert and oriented to person, place, and time. Mental status is at baseline.  Psychiatric:        Mood and Affect: Mood normal.        Behavior: Behavior normal.       No results found for any visits on 02/06/21.  Assessment & Plan     1. Chronic cough 2. Gastroesophageal reflux disease, unspecified whether esophagitis present 3. Esophageal dysphagia - Longstanding GERD, now with chronic cough and dysphagia - allergic rhinitis may be contributing to chronic cough somewhat, but given the positional nature of the coughing and clear lung exam today, I am more suspicious that her GERD may be causing her  chronic cough -She is also reporting some new dysphagia -We will increase omeprazole to 40 mg daily -If not improving, she should set up follow-up with her GI to consider EGD -Return precautions discussed  Meds ordered this encounter  Medications  . omeprazole (PRILOSEC) 40 MG capsule    Sig: Take 1 capsule (40 mg total) by mouth daily.    Dispense:  30 capsule    Refill:  3     Return if symptoms worsen or fail to improve.      I, Lavon Paganini, MD, have reviewed all documentation for this visit. The documentation on 02/06/21 for the exam, diagnosis, procedures, and orders are all accurate and complete.   Kelcey Wickstrom, Dionne Bucy, MD, MPH [facility]

## 2021-02-06 NOTE — Patient Instructions (Addendum)
Think about follow-up with Dr Allen Norris if this is not getting better.  Cough, Adult A cough helps to clear your throat and lungs. A cough may be a sign of an illness or another medical condition. An acute cough may only last 2-3 weeks, while a chronic cough may last 8 or more weeks. Many things can cause a cough. They include:  Germs (viruses or bacteria) that attack the airway.  Breathing in things that bother (irritate) your lungs.  Allergies.  Asthma.  Mucus that runs down the back of your throat (postnasal drip).  Smoking.  Acid backing up from the stomach into the tube that moves food from the mouth to the stomach (gastroesophageal reflux).  Some medicines.  Lung problems.  Other medical conditions, such as heart failure or a blood clot in the lung (pulmonary embolism). Follow these instructions at home: Medicines  Take over-the-counter and prescription medicines only as told by your doctor.  Talk with your doctor before you take medicines that stop a cough (cough suppressants). Lifestyle  Do not smoke, and try not to be around smoke. Do not use any products that contain nicotine or tobacco, such as cigarettes, e-cigarettes, and chewing tobacco. If you need help quitting, ask your doctor.  Drink enough fluid to keep your pee (urine) pale yellow.  Avoid caffeine.  Do not drink alcohol if your doctor tells you not to drink.   General instructions  Watch for any changes in your cough. Tell your doctor about them.  Always cover your mouth when you cough.  Stay away from things that make you cough, such as perfume, candles, campfire smoke, or cleaning products.  If the air is dry, use a cool mist vaporizer or humidifier in your home.  If your cough is worse at night, try using extra pillows to raise your head up higher while you sleep.  Rest as needed.  Keep all follow-up visits as told by your doctor. This is important.   Contact a doctor if:  You have new  symptoms.  You cough up pus.  Your cough does not get better after 2-3 weeks, or your cough gets worse.  Cough medicine does not help your cough and you are not sleeping well.  You have pain that gets worse or pain that is not helped with medicine.  You have a fever.  You are losing weight and you do not know why.  You have night sweats. Get help right away if:  You cough up blood.  You have trouble breathing.  Your heartbeat is very fast. These symptoms may be an emergency. Do not wait to see if the symptoms will go away. Get medical help right away. Call your local emergency services (911 in the [place] Do not drive yourself to the hospital. Summary  A cough helps to clear your throat and lungs. Many things can cause a cough.  Take over-the-counter and prescription medicines only as told by your doctor.  Always cover your mouth when you cough.  Contact a doctor if you have new symptoms or you have a cough that does not get better or gets worse. This information is not intended to replace advice given to you by your health care provider. Make sure you discuss any questions you have with your health care provider. Document Revised: 12/10/2019 Document Reviewed: 11/09/2018 Elsevier Patient Education  [place].

## 2021-03-08 ENCOUNTER — Other Ambulatory Visit: Payer: Self-pay | Admitting: Family Medicine

## 2021-03-08 DIAGNOSIS — Z1231 Encounter for screening mammogram for malignant neoplasm of breast: Secondary | ICD-10-CM

## 2021-03-15 ENCOUNTER — Ambulatory Visit: Payer: Medicare HMO | Admitting: Gastroenterology

## 2021-05-07 ENCOUNTER — Other Ambulatory Visit: Payer: Self-pay | Admitting: Family Medicine

## 2021-11-16 ENCOUNTER — Telehealth: Payer: Self-pay | Admitting: Family Medicine

## 2021-11-16 NOTE — Telephone Encounter (Signed)
Patient declined the Medicare Wellness Visit with [facility]    Moved to [place] - No longer a patient

## 2022-04-12 ENCOUNTER — Telehealth: Payer: Self-pay | Admitting: Orthopaedic Surgery

## 2022-04-12 NOTE — Telephone Encounter (Signed)
Received vm from patient, seeking date from an old surgery. IC,lmvm for pt to [facility]. [phone].

## 2022-04-15 ENCOUNTER — Telehealth: Payer: Self-pay | Admitting: Orthopaedic Surgery

## 2022-04-15 NOTE — Telephone Encounter (Signed)
Spoke to patient this morning,advised her date of surgery with Dr. Lorin Mercy was 09/26/2009 on Lt Ankle.
